# Patient Record
Sex: Female | Born: 1935 | Race: White | Hispanic: No | Smoking: Never smoker
Health system: Southern US, Community
[De-identification: ages and names within clinical notes are randomized; demographics above are authoritative.]

## PROBLEM LIST (undated history)

## (undated) DIAGNOSIS — R627 Adult failure to thrive: Secondary | ICD-10-CM

## (undated) DIAGNOSIS — R209 Unspecified disturbances of skin sensation: Secondary | ICD-10-CM

## (undated) DIAGNOSIS — R413 Other amnesia: Secondary | ICD-10-CM

## (undated) DIAGNOSIS — E559 Vitamin D deficiency, unspecified: Secondary | ICD-10-CM

## (undated) DIAGNOSIS — E785 Hyperlipidemia, unspecified: Secondary | ICD-10-CM

## (undated) DIAGNOSIS — R7301 Impaired fasting glucose: Secondary | ICD-10-CM

## (undated) DIAGNOSIS — F418 Other specified anxiety disorders: Secondary | ICD-10-CM

## (undated) DIAGNOSIS — F32A Depression, unspecified: Secondary | ICD-10-CM

## (undated) DIAGNOSIS — F0390 Unspecified dementia without behavioral disturbance: Secondary | ICD-10-CM

## (undated) DIAGNOSIS — S329XXA Fracture of unspecified parts of lumbosacral spine and pelvis, initial encounter for closed fracture: Secondary | ICD-10-CM

## (undated) DIAGNOSIS — H6120 Impacted cerumen, unspecified ear: Secondary | ICD-10-CM

## (undated) DIAGNOSIS — M810 Age-related osteoporosis without current pathological fracture: Secondary | ICD-10-CM

## (undated) DIAGNOSIS — K635 Polyp of colon: Secondary | ICD-10-CM

## (undated) DIAGNOSIS — J479 Bronchiectasis, uncomplicated: Secondary | ICD-10-CM

## (undated) DIAGNOSIS — H9193 Unspecified hearing loss, bilateral: Secondary | ICD-10-CM

## (undated) DIAGNOSIS — F329 Major depressive disorder, single episode, unspecified: Secondary | ICD-10-CM

## (undated) DIAGNOSIS — D489 Neoplasm of uncertain behavior, unspecified: Secondary | ICD-10-CM

## (undated) HISTORY — DX: Neoplasm of uncertain behavior, unspecified: D48.9

## (undated) HISTORY — PX: JOINT REPLACEMENT: SHX530

## (undated) HISTORY — DX: Polyp of colon: K63.5

## (undated) HISTORY — PX: ORIF HIP FRACTURE: SHX2125

## (undated) HISTORY — DX: Impacted cerumen, unspecified ear: H61.20

## (undated) HISTORY — DX: Vitamin D deficiency, unspecified: E55.9

## (undated) HISTORY — DX: Adult failure to thrive: R62.7

## (undated) HISTORY — DX: Depression, unspecified: F32.A

## (undated) HISTORY — DX: Fracture of unspecified parts of lumbosacral spine and pelvis, initial encounter for closed fracture: S32.9XXA

## (undated) HISTORY — DX: Bronchiectasis, uncomplicated: J47.9

## (undated) HISTORY — DX: Hyperlipidemia, unspecified: E78.5

## (undated) HISTORY — DX: Unspecified disturbances of skin sensation: R20.9

## (undated) HISTORY — DX: Other specified anxiety disorders: F41.8

## (undated) HISTORY — PX: CATARACT EXTRACTION, BILATERAL: SHX1313

## (undated) HISTORY — DX: Unspecified hearing loss, bilateral: H91.93

## (undated) HISTORY — DX: Impaired fasting glucose: R73.01

## (undated) HISTORY — DX: Age-related osteoporosis without current pathological fracture: M81.0

## (undated) HISTORY — DX: Other amnesia: R41.3

## (undated) HISTORY — DX: Unspecified dementia, unspecified severity, without behavioral disturbance, psychotic disturbance, mood disturbance, and anxiety: F03.90

---
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
## 1998-10-01 ENCOUNTER — Other Ambulatory Visit: Admission: RE | Admit: 1998-10-01 | Discharge: 1998-10-01 | Payer: Self-pay | Admitting: Endocrinology

## 1999-05-27 ENCOUNTER — Other Ambulatory Visit: Admission: RE | Admit: 1999-05-27 | Discharge: 1999-05-27 | Payer: Self-pay | Admitting: Podiatry

## 2000-04-25 ENCOUNTER — Other Ambulatory Visit: Admission: RE | Admit: 2000-04-25 | Discharge: 2000-04-25 | Payer: Self-pay | Admitting: Endocrinology

## 2000-07-07 ENCOUNTER — Other Ambulatory Visit: Admission: RE | Admit: 2000-07-07 | Discharge: 2000-07-07 | Payer: Self-pay | Admitting: Orthopedic Surgery

## 2002-10-11 ENCOUNTER — Ambulatory Visit (HOSPITAL_COMMUNITY): Admission: RE | Admit: 2002-10-11 | Discharge: 2002-10-11 | Payer: Self-pay | Admitting: Endocrinology

## 2002-10-11 ENCOUNTER — Encounter: Payer: Self-pay | Admitting: Endocrinology

## 2003-07-24 ENCOUNTER — Ambulatory Visit (HOSPITAL_COMMUNITY): Admission: RE | Admit: 2003-07-24 | Discharge: 2003-07-24 | Payer: Self-pay | Admitting: Gastroenterology

## 2004-06-05 ENCOUNTER — Inpatient Hospital Stay (HOSPITAL_COMMUNITY): Admission: EM | Admit: 2004-06-05 | Discharge: 2004-06-11 | Payer: Self-pay | Admitting: *Deleted

## 2004-06-11 ENCOUNTER — Inpatient Hospital Stay
Admission: RE | Admit: 2004-06-11 | Discharge: 2004-06-17 | Payer: Self-pay | Admitting: Physical Medicine & Rehabilitation

## 2005-04-12 ENCOUNTER — Ambulatory Visit: Payer: Self-pay | Admitting: Pulmonary Disease

## 2005-07-29 IMAGING — CR DG HIP (WITH OR WITHOUT PELVIS) 2-3V*L*
2 series · 2 of 2 positions shown · non-contrast
Comparison: none

CLINICAL DATA: Fall with left hip pain.
 LUMBAR SPINE (4 VIEW)
 There is mild osteopenia.  No acute bony abnormality.  Specifically, no evidence of fracture or malalignment.  Disk spaces well maintained.
 IMPRESSION
 No acute abnormality.
 LEFT HIP (2 VIEW)
 There is a fracture noted through the inferior pubic ramus on the left.  This is presumably acute.  No fracture through the femoral neck.  
 Left inferior pubic rami fracture.
 PELVIS (1 VIEW)
 The left inferior pubic ramus fracture is again noted.  No additional acute bony abnormality. Specifically, no distal fractures.
 Left inferior pubic ramus fracture.

[view not recorded (1 of 2)]
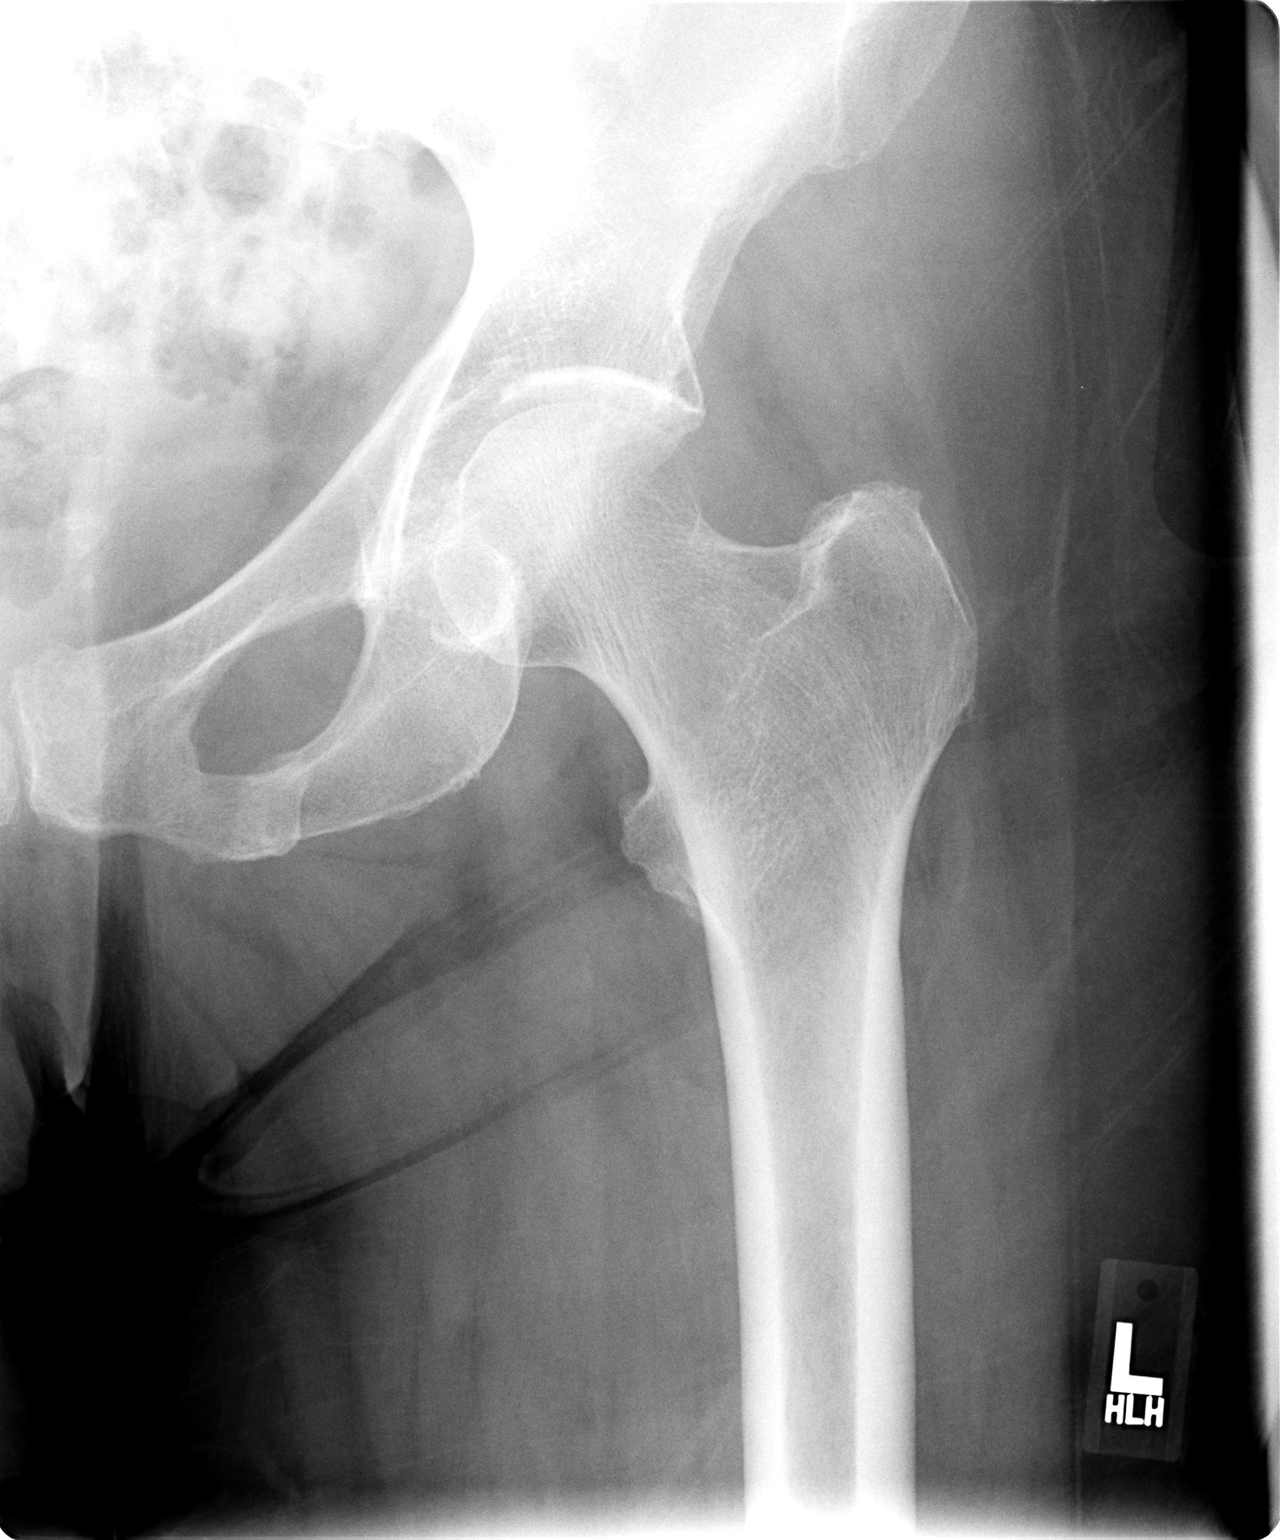

[view not recorded (2 of 2)]
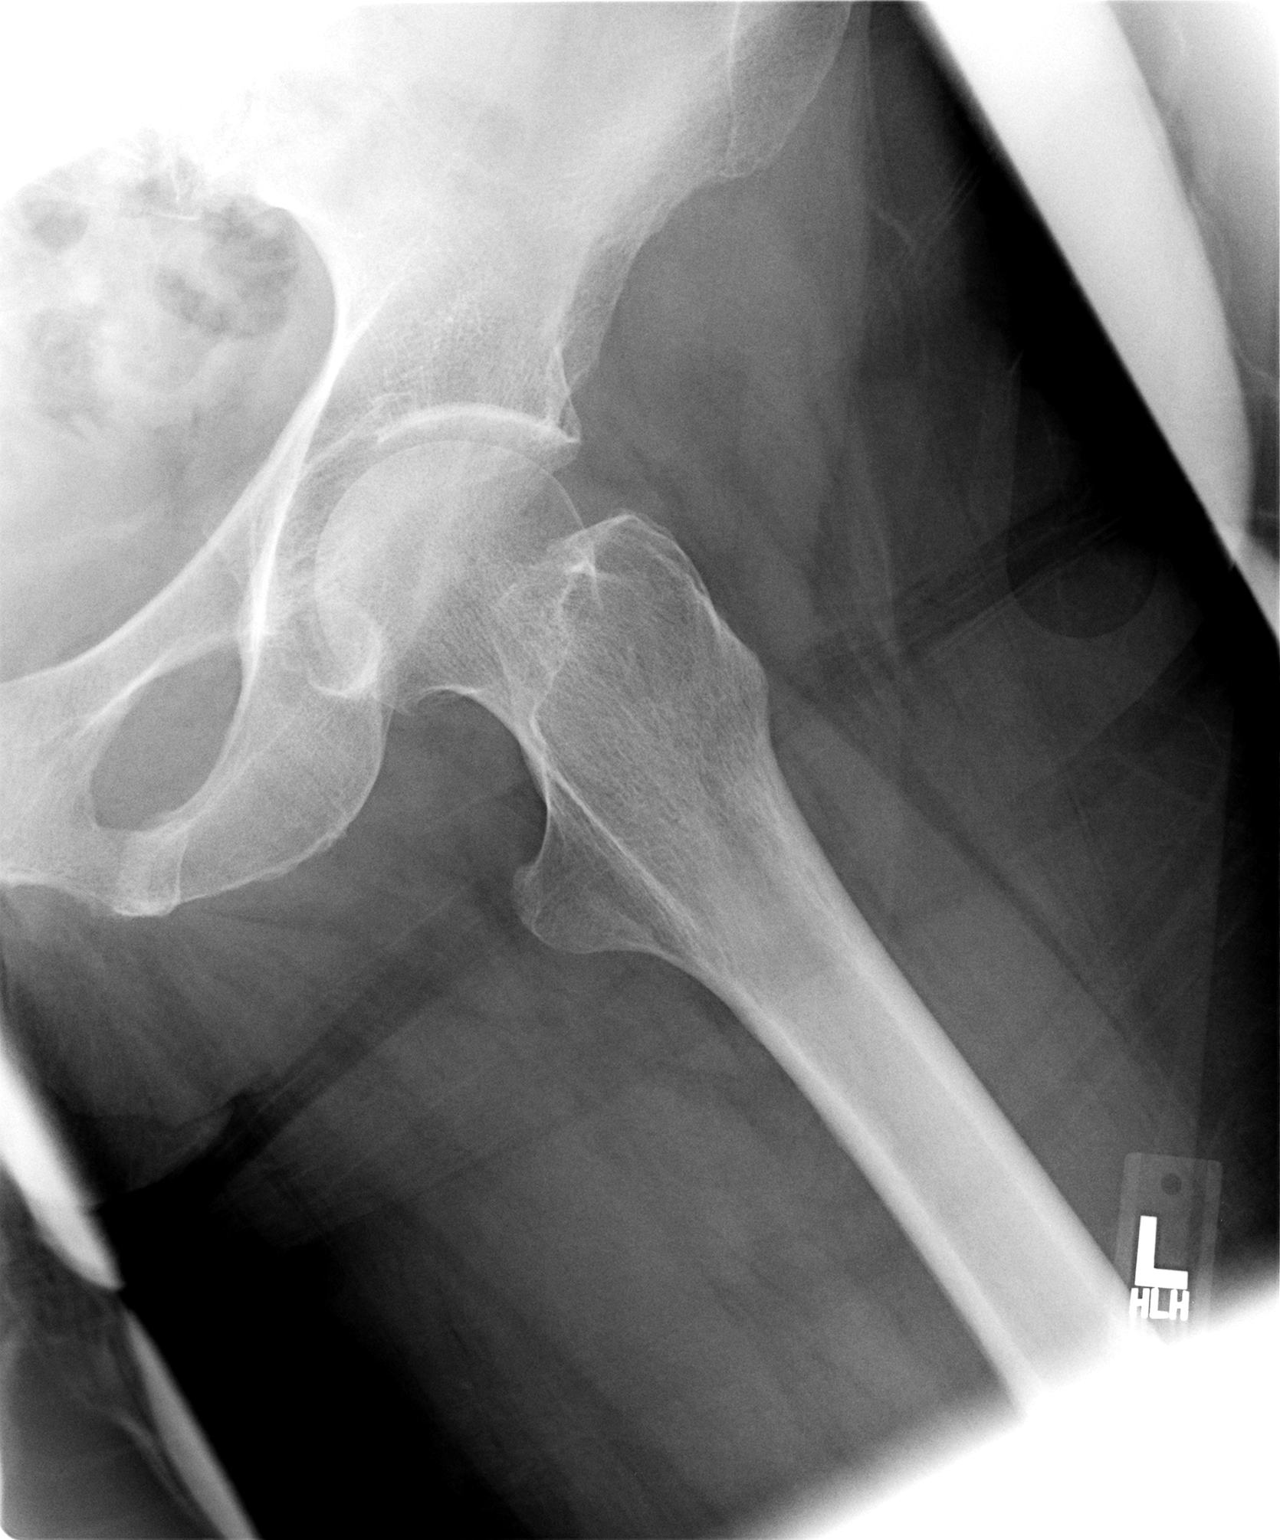

[2 of 2 positions shown; findings below may reference images not displayed]

## 2005-07-29 IMAGING — CT CT RECONSTRUCTION
1 of 4 series · 15 of 36 positions shown, 19 images · non-contrast
Comparison: none

CLINICAL DATA: The patient fell and has pelvic fractures.  
 CT SCAN OF THE PELVIS WITH MULTIPLANAR RECONSTRUCTIONS 06/05/04 
 Scans were performed without contrast through the pelvis.  There is a subtle impaction fracture of the left side of the sacrum.  The fracture is vertical through the left sacral ala.  Also a subtle impaction cortical buckle fracture of the ischium just anterior and medial to the anterior wall of the acetabulum near the origin of the superior pubic ramus.  There is also a minimally displaced fracture of the left inferior pubic ramus.  Note is also made of a 3.0 X 1.6 cm hyperdense lesion in the left ovary.  This could represent a hemorrhagic lesion in the left ovary or possibly calcification in the left ovarian mass.  Uterus and right ovary appear normal in size and configuration.  There is a subtle impaction fracture of the left side of symphysis pubis as well, best demonstrated on the coronal reconstructed images.  
 No other abnormality. 
 IMPRESSION
 Fracture of the left inferior pubic ramus, the left ischium anteriorly just anterior to the acetabulum, and vertically through the left side of the sacrum.  None of the fractures are significantly displaced. 
 Dense lesion in the left ovary which could be a hemorrhagic cyst or a partially calcified ovarian mass.  Pelvic ultrasound recommended for further characterization of this lesion of the left ovary.  
 CT MULTIPLANAR RECONSTRUCTION OF THE PELVIS
 Sagittal and coronal reconstructions were performed which better demonstrate the fractures of the inferior and superior pubic rami and also demonstrate the left sacral ala fracture.  The fracture of the left ischium is less well seen on the reconstructed images. 
 Multiple pelvic fractures as described.

[Series 102: pelvis for fx · axial · 0.70mm/px · z∈[-279,-33]mm · 15 of 215 slices shown, 19 images]
[im 9/215  soft-tissue]
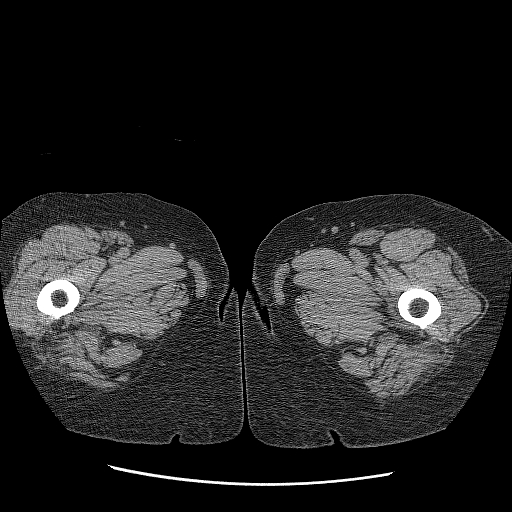
[im 9/215  bone]
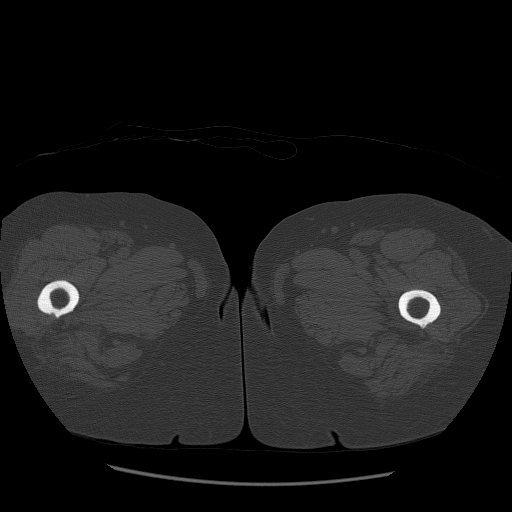
[im 27/215  soft-tissue]
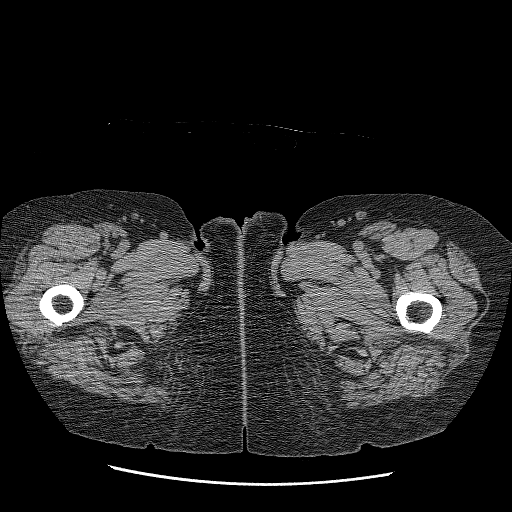
[im 45/215  soft-tissue]
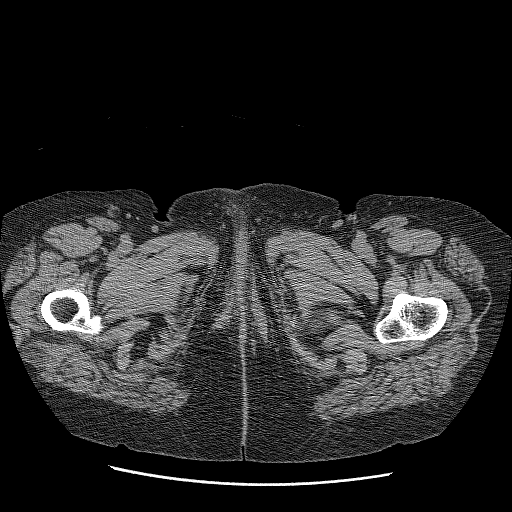
[im 63/215  soft-tissue]
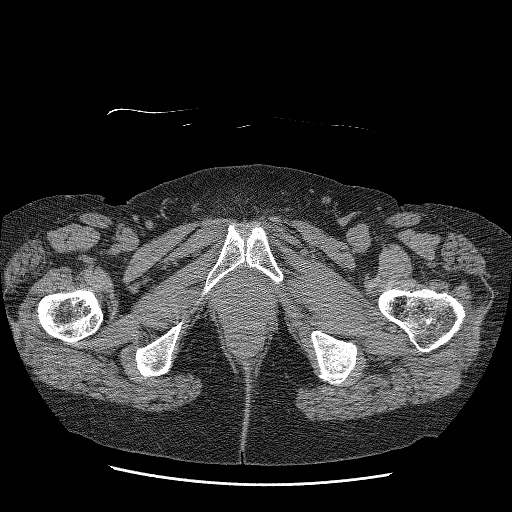
[im 72/215  soft-tissue]
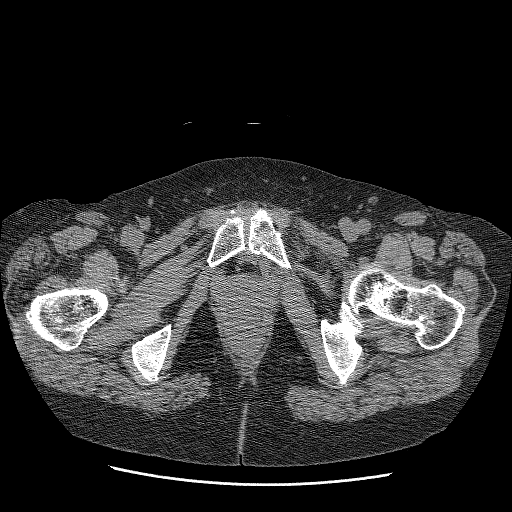
[im 90/215  soft-tissue]
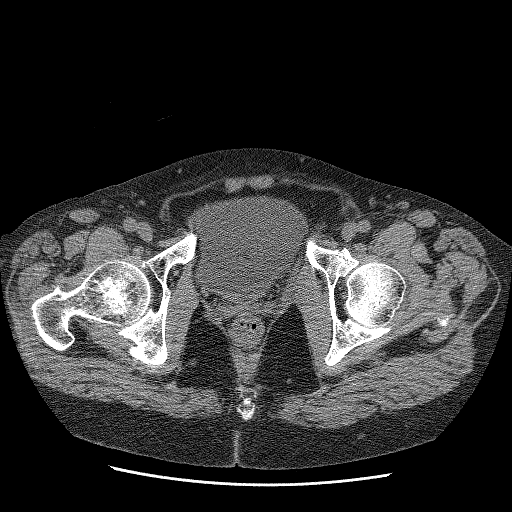
[im 108/215  soft-tissue]
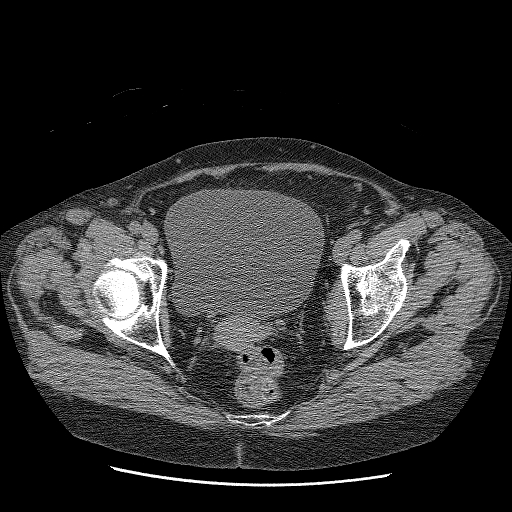
[im 125/215  soft-tissue]
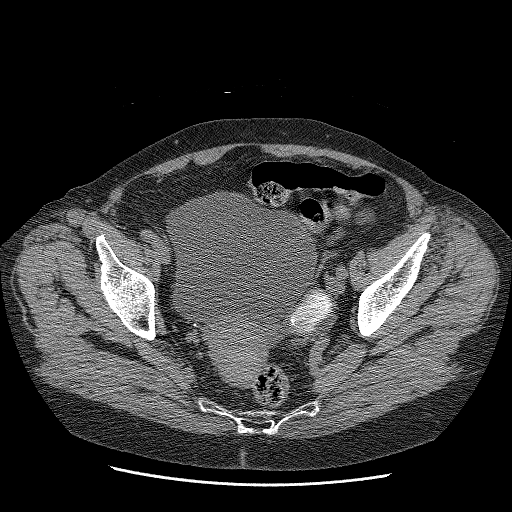
[im 143/215  soft-tissue]
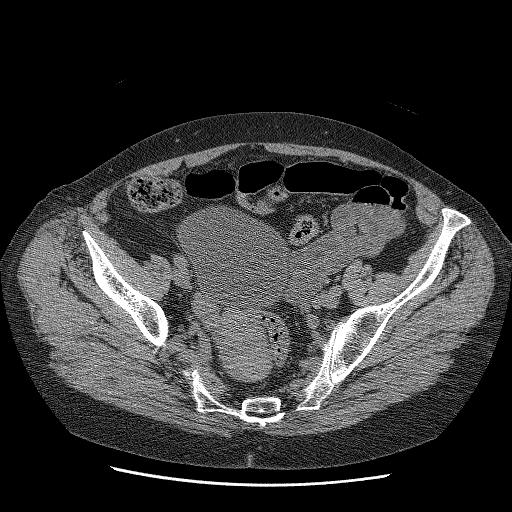
[im 143/215  bone]
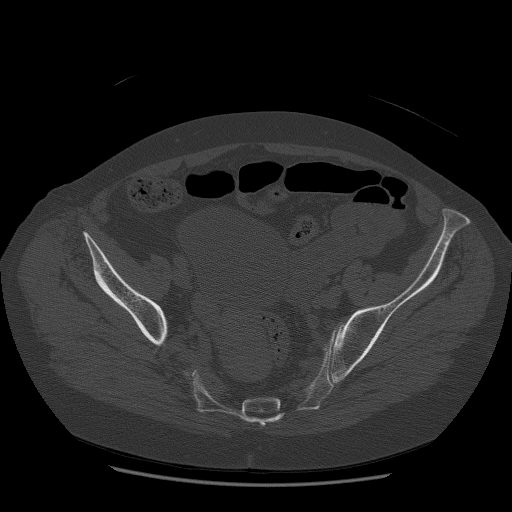
[im 152/215  soft-tissue]
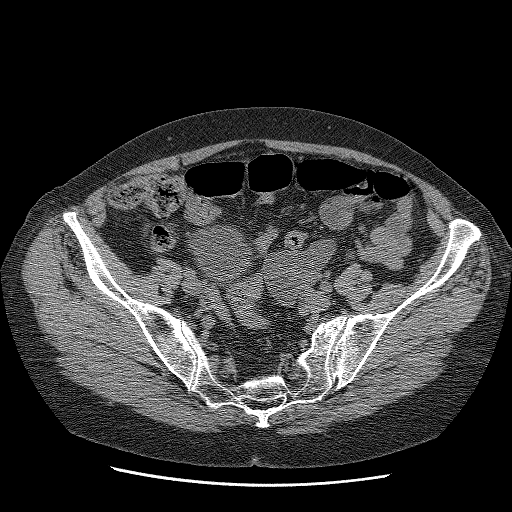
[im 170/215  soft-tissue]
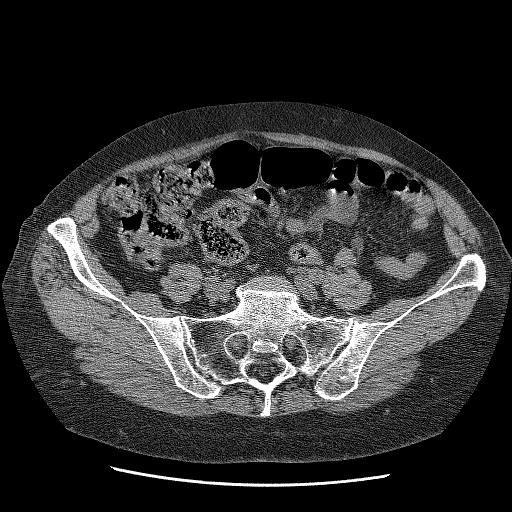
[im 179/215  lung]
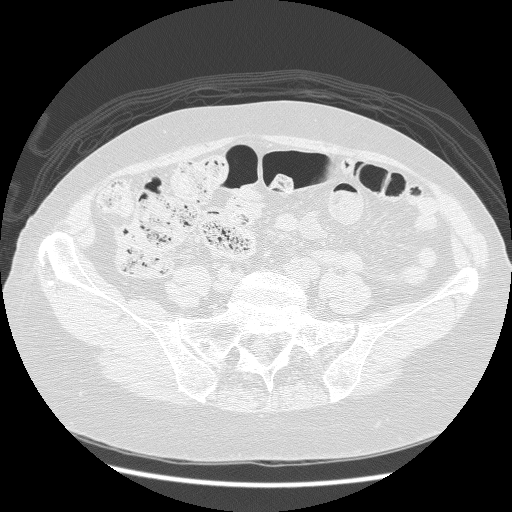
[im 188/215  soft-tissue]
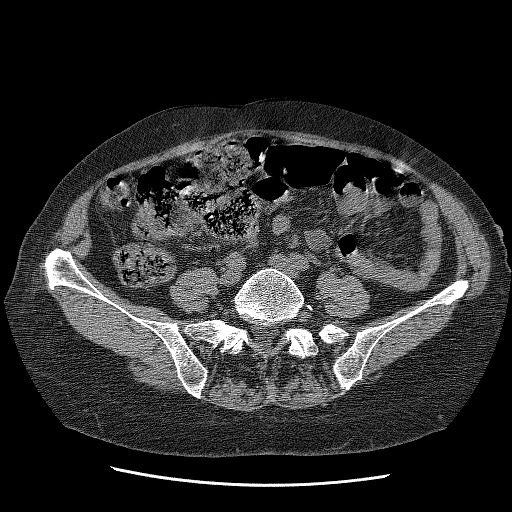
[im 188/215  lung]
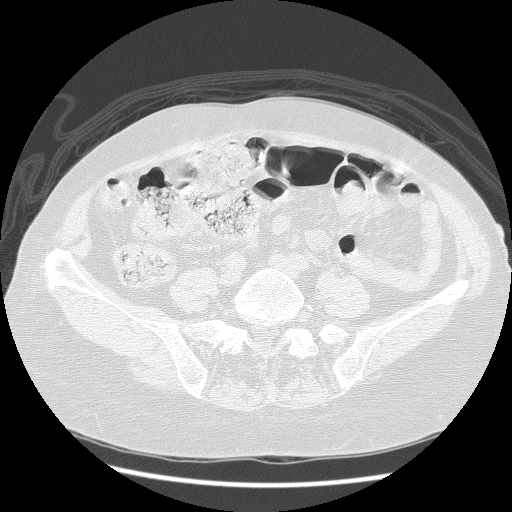
[im 197/215  lung]
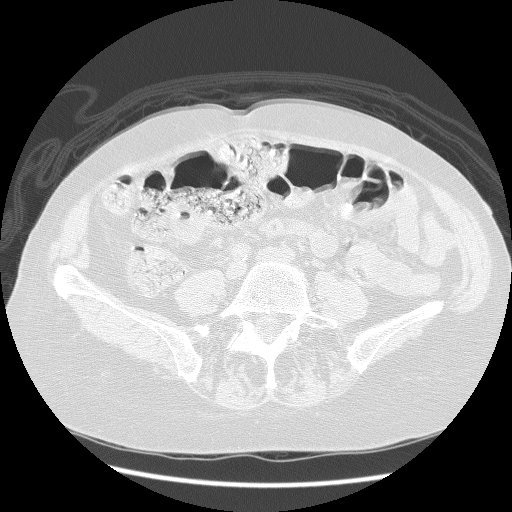
[im 206/215  soft-tissue]
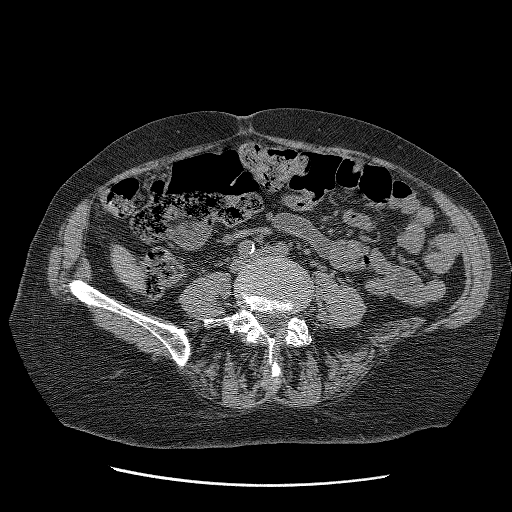
[im 206/215  lung]
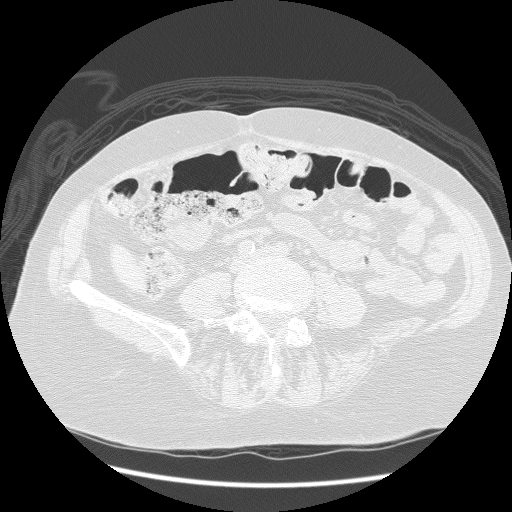

[15 of 36 positions shown; findings below may reference images not displayed]

## 2005-08-26 ENCOUNTER — Ambulatory Visit: Payer: Self-pay | Admitting: Pulmonary Disease

## 2006-01-18 ENCOUNTER — Ambulatory Visit: Payer: Self-pay | Admitting: Pulmonary Disease

## 2006-04-05 ENCOUNTER — Other Ambulatory Visit: Admission: RE | Admit: 2006-04-05 | Discharge: 2006-04-05 | Payer: Self-pay | Admitting: Gynecology

## 2006-06-06 ENCOUNTER — Encounter (INDEPENDENT_AMBULATORY_CARE_PROVIDER_SITE_OTHER): Payer: Self-pay | Admitting: Specialist

## 2006-06-06 ENCOUNTER — Ambulatory Visit (HOSPITAL_COMMUNITY): Admission: RE | Admit: 2006-06-06 | Discharge: 2006-06-07 | Payer: Self-pay | Admitting: Gynecology

## 2006-06-07 HISTORY — PX: OTHER SURGICAL HISTORY: SHX169

## 2007-08-05 ENCOUNTER — Emergency Department (HOSPITAL_COMMUNITY): Admission: EM | Admit: 2007-08-05 | Discharge: 2007-08-05 | Payer: Self-pay | Admitting: Emergency Medicine

## 2011-05-07 NOTE — H&P (Signed)
[name], [name]             ACCOUNT NO.:  [id number]   MEDICAL RECORD NO.:  [id number]          PATIENT TYPE:  AMB   LOCATION:                                FACILITY:  [facility]   PHYSICIAN:  Ivor Costa. Farrel Gobble, M.D.      DATE OF BIRTH:   DATE OF ADMISSION:  DATE OF DISCHARGE:                                HISTORY & PHYSICAL   CHIEF COMPLAINT:  Ovarian mass.   HISTORY OF PRESENT ILLNESS:  The patient is a 77 year old postmenopausal  woman who presented as a new GYN with a complaint of nonspecific discharge.  Based on that, we went ahead and did an ultrasound to evaluate her pelvis,  at which point a solid ovarian mass on the left-hand side was noted.  The  mass measured 3.4 x 2.3 x 3.6 cm.  The patient, of note, had had a CAT scan  as a result of a pelvic injury almost 2 years prior, which showed the  presence of the mass; however, was reported as being small, although with  CAT scan measuring 3 x 1.6.  The patient had no fluid in her pelvis, and the  CA-125 which was nonspecific but slightly elevated at 37.9.  The patient  presents now for a bilateral oophorectomy with pelvic washings.   The patient's past OB/GYN history is unremarkable.  She went into  spontaneous menopause a number of years ago; has not had postmenopausal  bleeding.  She denied any perineal or vulvar pruritus.  She had a Pap smear,  which was normal.  She was noted to have urinary tract infection at her  initial evaluation and most recently to have a bacterial infection in the  vagina as well.   Her past medical history is significant for depression for which she is on  Lexapro and Wellbutrin.  She also has a history of osteoporosis for which  she is on Fosamax D.   SOCIAL HISTORY:  The patient had a breast biopsy in 1972, which was benign,  and a broken knee as a result of a fall.   FAMILY HISTORY:  Negative for gynecologic cancer.   SOCIAL HISTORY:  She is widowed.  She drinks 1 alcoholic beverage a  day.  She drinks 3 cups of coffee a day.  She is highly functional and lives  alone.   PHYSICAL EXAMINATION:  GENERAL:  On examination, she is well-appearing  female in no acute distress.  HEART:  Her heart was regular rate.  LUNGS:  Her lungs were clear to auscultation.  ABDOMEN:  Abdomen was soft and nontender without rebound or guarding.  BREASTS:  Her breasts are without mass, discharge, or retractions.  Axillae  and clavicular lymph nodes are negative.  She had postmenopausal changes to  the external genitalia with some scarring over the clitoral hood and absent  of labia minora.  The vaginal introitus was tight consistent with her  nulliparous status.  The vagina had brown-tinted discharge, but no bleeding  from the os.  There was no cervical motion tenderness.  The uterus was  mobile and nontender, as were  the adnexa.   Wet prep came back positive for yeast vaginitis as well as bacterial  vaginosis.  The patient was so informed.  We will go ahead and treat her  with Flagyl 500 b.i.d. #14, and a single fluconazole preoperatively, but she  will present on the morning of the 18th for a laparoscopic LSO, possible  RSO, with pelvic washings.  Because of her age, the patient will be observed  23 hours postoperatively.      Ivor Costa. Farrel Gobble, M.D.  Electronically Signed     THL/MEDQ  D:  06/02/2006  T:  06/02/2006  Job:  [id number]

## 2011-05-07 NOTE — H&P (Signed)
NAME:  [name], [name]                        ACCOUNT NO.:  [id number]   MEDICAL RECORD NO.:  [id number]                   PATIENT TYPE:  INP   LOCATION:  5158                                 FACILITY:  [facility]   PHYSICIAN:  Tera Mater. Evlyn Kanner, M.D.              DATE OF BIRTH:  Feb 01, 1926   DATE OF ADMISSION:  06/05/2004  DATE OF DISCHARGE:                                HISTORY & PHYSICAL   The patient is a fairly healthy 77 year old white female longstanding  patient of my practice.  She was out working in the garden and her dog was  at her side.  When the neighbors dog came by, the dog started chasing and  grabbed the patient up in the leash and threw her to the ground.  She had  pelvic pain and inability to stand.  A friend helped her get to the  emergency room and she is now found to have multiple pelvic fractures.  She  is in fairly significant pain, although the medication has kicked in.  She  has had no chest pain or shortness of breath.  There was no neurological  complaint prior to this.  This was purely an accidental fall.  In general,  she has been doing well recently with no active complaints.   PAST MEDICAL HISTORY:  Past medical history includes osteoporosis with the  last DEXA in 12/03 with a T-score of the LS-spine of -2.3 and a T-score at  the femoral neck of -2.1.  She has a history of hyperlipidemia, history of  hematuria, history of benign positional vertigo.  She has had a history of  colonic polyps with the last colonoscopy 2004.  Had a history of depression.  Had a history of bronchoscopy after a pulmonary infection in 1991.   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   SOCIAL HISTORY:  Social history reveals she is widowed in the last couple of  years.  She has three children and two grandchildren.  She is a non-smoker,  non-drinker.  She has no known drug  allergies.   FAMILY HISTORY:  Family history reveals mother is alive at 84, dad died of  heart disease and diabetes.   MEDICATIONS:  Medications include Fosamax 70 daily, Effexor XR 75 daily,  Vytorin 10/40 daily, aspirin 81 daily, calcium and Ocuvite.   PHYSICAL EXAMINATION:  VITAL SIGNS:  Blood pressure is 132/80, pulse 74,  respirations 20, temperature 97.4.  GENERAL:  Healthy non-toxic appearing white female sitting up in no distress  at the present time.  HEENT:  Sclerae are anicteric.  No lid lag or exophthalmus is present.  No  nystagmus is present.  Oral mucous membranes moist.  Head is atraumatic.  NECK:  Supple without JVD, bruits or thyromegaly.  LUNGS:  Clear without wheezes, rales or rhonchi.  No  accessory muscles in  use.  HEART:  Regular with no murmurs, rubs or gallops.  ABDOMEN:  Soft, nontender.  No hepatosplenomegaly.  No masses or pulsations  are present.  EXTREMITIES:  Strong distal pulses bilaterally.  No edema is present.  There  is pain with movement of either leg.  NEUROLOGICAL:  The patient is awake, alert.  Mentating well.  Speech is  clear.  No tremors present.  No hallucination or delusions present.  Grip is  equal bilaterally.  No neurological deficits are noted.  Sensation is intact  to light touch distally.   We have a 77 year old white female suffering multiple pelvic fractures after  a fall.  Clearly with her living alone and need for pain control and  stabilization, she needs to come to the hospital.  We may have to have a  rehab or skilled nursing facility stay depending on her clinical process.  Miacalcin will be added for pain.  Lovenox will be  covered while immobile  and we will get her back on her Fosamax when she is able to sit up and eat  well.  There is no evidence of any cardiac or neurological problems related  to this.  There is no evidence of any compression neuropathy or other  compromise or pelvic fracture.  The patient should do  clinically well.                                                Tera Mater. Evlyn Kanner, M.D.    SAS/MEDQ  D:  06/11/2004  T:  06/12/2004  Job:  [id number]

## 2011-05-07 NOTE — Op Note (Signed)
   NAME:  [name], [name]                        ACCOUNT NO.:  [id number]   MEDICAL RECORD NO.:  [id number]                   PATIENT TYPE:  AMB   LOCATION:  ENDO                                 FACILITY:  [facility]   PHYSICIAN:  John C. Madilyn Fireman, M.D.                 DATE OF BIRTH:  09-Oct-1926   DATE OF PROCEDURE:  07/24/2003  DATE OF DISCHARGE:                                 OPERATIVE REPORT   PROCEDURE:  Colonoscopy.   INDICATION FOR PROCEDURE:  History of adenomatous colon polyps with last  colonoscopy five years ago.   DESCRIPTION OF PROCEDURE:  The patient was placed in the left lateral  decubitus position and placed on the pulse monitor with continuous low-flow  oxygen delivered by nasal cannula.  She was sedated with 75 mcg IV fentanyl  and 7 mg IV Versed.  The Olympus video colonoscope was inserted into the  rectum and advanced to the cecum, confirmed by transillumination at  McBurney's point and visualization of the ileocecal valve and appendiceal  orifice.  The prep was good.  The cecum, ascending, transverse, descending,  and sigmoid colon all appeared normal with no masses, polyps, diverticula,  or other mucosal abnormalities.  The rectum likewise appeared normal, and  retroflexed view of the anus revealed no obvious internal hemorrhoids.  The  colonoscope was then withdrawn and the patient returned to the recovery room  in stable condition.  She tolerated the procedure well, and there were no  immediate complications.   IMPRESSION:  Normal colonoscopy.   PLAN:  Repeat study in five years based on her prior history of polyps.                                               John C. Madilyn Fireman, M.D.    JCH/MEDQ  D:  07/24/2003  T:  07/25/2003  Job:  [id number]   cc:   Jeannett Senior A. Evlyn Kanner, M.D.  [address]  [place]  [place] [address]  Fax: [phone]

## 2011-05-07 NOTE — Discharge Summary (Signed)
NAME:  [name], [name]                        ACCOUNT NO.:  [id number]   MEDICAL RECORD NO.:  [id number]                   PATIENT TYPE:  INP   LOCATION:  5158                                 FACILITY:  [facility]   PHYSICIAN:  Tera Mater. Evlyn Kanner, M.D.              DATE OF BIRTH:  1926/09/20   DATE OF ADMISSION:  06/05/2004  DATE OF DISCHARGE:  06/10/2004                                 DISCHARGE SUMMARY   DISCHARGE DIAGNOSES:  1. Multiple pelvic fractures after a fall.  2. History of known osteoporosis.  3. Hyperlipidemia.  4. History of colonic polyps.  5. Depression, clinically stable.   HOSPITAL COURSE:  [name] is a healthy 77 year old white female with a  history of osteoporosis without prior fractures.  She was working in the  garden and the dog that was with her started chasing another dog and she got  tangled up in the leash and was thrown to the ground.  She was seen in the  emergency room with multiple pelvic fractures and inability to ambulate.  The patient does live by herself and due to pain and mobility issues, was  brought into the hospital for stabilization.  Fortunately, her hospital  course has been very good.  She has had no evidence of DVT, no evidence of  pulmonary embolus and no evidence of substantial bleeding.  She has had  progressive improvement to the point that she can now hop a bit with a  walker with less pain.  She still has pain with sitting and with turning  over.  She is sleeping well.  Her bowels are working well.  She has had no  cardiac or neurologic complaints.  She has had no compression neuropathy  symptoms.  Overall, she has done well.  She has no active complaints, other  than the pelvic pain.   MEDICATIONS:  Medications will include:  1. Fosamax with D 70 mg daily.  2. Effexor XR 75 mg daily.  3. Vytorin 10/40 daily.  4. She is on Lovenox until more ambulatory and then will resume her aspirin.  5. She is on calcium.   LABORATORY  DATA:  She did have a urine at presentation which was basically  negative.   She had a CT of the pelvis showing fracture of the left inferior pubic  ramus, left ischium anteriorly just inferior to the acetabulum and  vertically to the left side of the sacrum and none of the fractures were  significantly displaced.  There was a dense lesion of the left ovary with a  hemorrhagic cyst or possibly a calcified ovarian mass and a pelvic  ultrasound was recommended; that has not yet been completed.   Plain film x-rays showed a left inferior pubic ramus fracture.   SUMMARY:  In summary, we have a healthy 77 year old white female who had a  fall and fractured her pelvis.  We are now working on  ambulation and she is  going to go to [facility] for further strengthening and independence.  I suspect  this will be a short hospital stay.  As an outpatient, when she is up to it,  we will consider adding an ultrasound of the ovaries; at the present time, I  think this would be a very painful thing for her and I do not suspect that  this is a lesion of any significance.   DIET:  Diet is regular.   ACTIVITY:  Activity is as tolerated.  OT and PT will be needed.                                                Tera Mater. Evlyn Kanner, M.D.    SAS/MEDQ  D:  06/11/2004  T:  06/11/2004  Job:  [id number]

## 2011-05-07 NOTE — Op Note (Signed)
[name], [name]              ACCOUNT NO.:  [id number]   MEDICAL RECORD NO.:  [id number]          PATIENT TYPE:  AMB   LOCATION:  SDC                           FACILITY:  [facility]   PHYSICIAN:  Ivor Costa. Farrel Gobble, M.D. DATE OF BIRTH:  10-21-1926   DATE OF PROCEDURE:  06/06/2006  DATE OF DISCHARGE:                                 OPERATIVE REPORT   PREOPERATIVE DIAGNOSIS:  Left ovarian mass.   POSTOPERATIVE DIAGNOSIS:  Left ovarian mass.   PROCEDURE:  1.  Laparoscopic bilateral salpingo-oophorectomy.  2.  Pelvic washings.   SURGEON:  Ivor Costa. Farrel Gobble, M.D.   ASSISTANT:  Rande Brunt. Eda Paschal, M.D.   ANESTHESIA:  General.   IV FLUIDS:  1700 mL lactated Ringer's.   ESTIMATED BLOOD LOSS:  Minimal.   URINE OUTPUT:  100 mL clear urine.   FINDINGS:  Was enlarged left ovary that was mobile and atrophic right ovary  and uterus was unremarkable for pedunculated subserosal fibroids.   PATHOLOGY:  Tubes, ovaries and a questionable fibroid versus ovarian  remnant.   PROCEDURE:  The patient was taken to operating room.  General anesthesia was  induced and placed in dorsal lithotomy position, prepped and draped usual  sterile fashion.  A bivalve speculum was placed in the vagina.  Cervix was  visualized and grasped with the single-tooth tenaculum.  Cervical os was  noted to be stenotic and the uterine manipulator could not be placed  therefore the single tooth tenaculum was rotated placed anterior-posterior  on the cervix.  Gloves were then changed.  Infraumbilical incision was made  with the scalpel and the pelvis was entered with low flow on the Optiview  scope. Placement in the abdomen was confirmed. The pneumoperitoneum was then  created.  The patient was placed in Trendelenburg.  We switched to operative  scope in order to sweep bowels away from the pelvis.  The patient was noted  to have markedly dilated loops of bowel that was obstructing the pelvic  view. Also limited  visualization of the upper abdomen.  Once the bowels were  swept away two lower ports were then made under direct visualization 5 mm.  Using these two lower ports pelvic washings were obtained.  The uterus was  elevated. The left adnexa was inspected. The ureter was seen with  peristalsis. This was actually done bilaterally.  The IP ligament was then  elevated, identified and treated with the tripolar. The dissection carried  through and then a small amount of bleeding was treated with cautery and  hemostasis was assured.  We then started to approach the adnexa from the  uterine side for better visualization and similarly the tubo-ovarian  ligament and tube were transected after treatment with cautery.  The ovary  was difficult to manipulate because of this size. The single-tooth tenaculum  had fallen off. Attention was then turned back to the vagina.  Cervix was  revisualized. Dilator was placed in order to help place the uterine  manipulator.  While the dilator was identifying the cervix it did perforate  the uterus. Bleeding was noted and it was at  the angle where there was a  fibroid. The area of bleeding on the uterus was then treated successfully  with the tripolar, irrigated and hemostasis of this area was confirmed. At  this point the patient received cefoxitin because of the uterine  perforation.  The dilator was successfully placed. Uterus was then able to  be rotated and the adnexa was successfully dissected off safely. It was  placed in the posterior cul-de-sac.  The uterus was then rotated towards the  left side. The IP was identified. The ureter was identified and similarly  cauterized and transected with the tripolar until the uterus was reached.  Questioned at the end whether or not we had left an ovarian remnant close to  the uterus and this was grasped and sharply dissected off. Post dissection  however, it seemed more fibroid than ovarian remnant. Three specimens were   then placed in the posterior cul-de-sac.  The 5 mm port was placed in the  left lower quadrant. The bag was placed, Endopouch was placed through the  infraumbilical port and noted two of three specimens were placed in the  pouch and brought through to the umbilical port. The incision was then  extended with the scalpel on the skin and sharply at the fascia and then the  specimens were removed in toto in the bag. The skin and fascia was then  clamped with an Allis in order to reestablish hemoperitoneum. Reinspection  of the pelvis assured hemostasis of all surgical lines. The small fibroid  was grasped with a lower port and brought out through the operative scope.  There was bleeding noted from the muscle and fascia on the extension of the  umbilical port.  The area was irrigated, elevated in the fashion, and  peritoneum was plicated in a running suture with 0 Vicryl.  Hemostasis was  assured. A small area of bleeding in the subcu was treated with cautery.  The skin was then reapproximated with 4-0 plain.  The lower ports were  removed under visualization.  The lower ports were reapproximated with Steri-  Strips. Total of 10 mL of 0.25% Marcaine was placed in the incision and  small pressure dressing was placed in the infraumbilical port.  A speculum  was then placed back in the vagina.  Cervix was visualized.  There was a  clot in the vagina.  However, once swept away, no further cervical bleeding  was appreciated.  The patient tolerated procedure well.  Sponge and needle  counts correct x2.  She was transferred to PACU in stable condition.      Ivor Costa. Farrel Gobble, M.D.  Electronically Signed     THL/MEDQ  D:  06/06/2006  T:  06/07/2006  Job:  [id number]

## 2011-05-07 NOTE — Discharge Summary (Signed)
[name], [name]              ACCOUNT NO.:  [id number]   MEDICAL RECORD NO.:  [id number]          PATIENT TYPE:  OIB   LOCATION:  9306                          FACILITY:  [facility]   PHYSICIAN:  Ivor Costa. Farrel Gobble, M.D. DATE OF BIRTH:  [birth year]   DATE OF ADMISSION:  06/06/2006  DATE OF DISCHARGE:  06/07/2006                                 DISCHARGE SUMMARY   PRINCIPAL DIAGNOSIS:  Solid left ovarian mass.   PRINCIPAL PROCEDURE:  Laparoscopic bilateral salpingo-oophorectomy.   ADDITIONAL PROCEDURE:  Pelvic washings.  Refer to the dictated H&P.   HOSPITAL COURSE:  The patient presented on the morning of June 06, 2006 and  underwent a laparoscopic BSO with pelvic washings under general anesthesia  for a grossly enlarged left ovary, atrophic right ovary and a subserosal  uterine fibroid as her findings.  The patient tolerated the procedure well.  She was transferred to the recovery room and because of age, she was  transferred to the postop floor for overnight observation.  The patient did  very well overnight.  She failed to have any pain.  She tolerated a regular  diet at the time.  By the morning of postop day #1, however, the patient had  just had her Foley removed and had not ambulated yet, but was awake and  alert, and was without any complaints.  She tolerated a regular diet.  She  remained afebrile.  All vitals were stable.  Her heart rate was a regular  rate.  Her lungs were clear to auscultation.  She had normoactive bowel  sounds.  Her abdomen was soft and nontender.  Her incisions were dry and  intact.  The patient will be ambulated this morning, and if successful  ambulation and voiding, she will then be discharged home with instructions  to follow up in the office in 2 weeks.  Since she did not require any pain  medications during her hospitalization, the patient will use over-the-  counter Motrin as needed.      Ivor Costa. Farrel Gobble, M.D.  Electronically Signed     THL/MEDQ  D:  06/07/2006  T:  06/07/2006  Job:  [id number]

## 2011-05-07 NOTE — Discharge Summary (Signed)
[name], [name]                        ACCOUNT NO.:  [id number]   MEDICAL RECORD NO.:  [id number]                   PATIENT TYPE:  ORB   LOCATION:  4524                                 FACILITY:  [facility]   PHYSICIAN:  Ranelle Oyster, M.D.             DATE OF BIRTH:  1926/02/04   DATE OF ADMISSION:  06/11/2004  DATE OF DISCHARGE:  06/17/2004                                 DISCHARGE SUMMARY   DISCHARGE DIAGNOSES:  1. Pelvic fracture.  2. Dyslipidemia.   HISTORY OF PRESENT ILLNESS:  [name] is a 77 year old female with  osteoporosis with fall on June 17, with inability to ambulate.  X-ray in the  ED showed fracture of the inferior superior pubic rami and left sacral ala.  The patient was admitted for pain control and on bed rest until June 20.  Pain control currently with Vicodin.  Therapies initiated with minimal  assist for bed mobility, supervision for transfers, minimal assist with  ambulating 30 feet with a rolling walker.  [facility] was consulted for  progressive therapies.   PAST MEDICAL HISTORY:  1. Depression.  2. Osteoporosis.  3. Dyslipidemia.  4. Occasional vertigo.  5. Left patella ORIF.  6. Tonsillectomy.   ALLERGIES:  No known drug allergies.   SOCIAL HISTORY:  The patient lives alone in one-level home with four to five  steps to entry.  Independent prior to admission.  She does not use any  tobacco.  She has a glass of wine in the evening.   FAMILY HISTORY:  Positive for coronary artery disease.   HOSPITAL COURSE:  [name] was admitted to [facility] on June 11, 2004, with  therapies to consist of PT/OT daily.  Past admission, labs were done showing  hemoglobin 11.3, hematocrit 33.6, white count 5.9, platelets 349.  Sodium  was 138, potassium 4.3, chloride 106, CO2 27, BUN 15, creatinine 0.2,  glucose 100.  The patient has had DVT prophylaxis.  She was on Miacalcin and  Os-Cal for her osteoporosis.  The patient's pain control was reasonable with  the use of p.r.n. medications.  She was able to participate in therapy and  progressed along well during her stay.  By the time of discharge, the  patient was modified independent level for all ADLs including simple home  management tasks.  She was modified independent for toileting.  She was  modified independent for transfers, modified independent for ambulating 400  feet with a rolling walker.  Further followup therapies to transition to  home including home health PT/OT.  On June 17, 2004, the patient is  discharged to home.   DISCHARGE MEDICATIONS:  1. Coated aspirin 81 mg a day.  2. Effexor XR 75 mg a day.  3. Miacalcin nasal spray one squirt daily.  4. Os-Cal plus D one p.o. t.i.d.  5. Zocor 40 mg q.h.s.  6. Vicodin one p.o. q.3-4h. p.r.n. pain.  7. Only use  Tylenol for pain.   ACTIVITY:  Use walker.   DIET:  Low fat diet.   SPECIAL INSTRUCTIONS:  No alcohol and no driving.   FOLLOW UP:  The patient is to follow up with Dr. Evlyn Kanner for routine check.  Follow up with Dr. Riley Kill as needed.      Greg Cutter, P.A.                    Ranelle Oyster, M.D.    PP/MEDQ  D:  07/09/2004  T:  07/10/2004  Job:  [id number]   cc:   Jeannett Senior A. Evlyn Kanner, M.D.  [address]  [place]  [place] [address]  Fax: [phone]

## 2013-05-23 ENCOUNTER — Encounter (INDEPENDENT_AMBULATORY_CARE_PROVIDER_SITE_OTHER): Payer: Medicare Other | Admitting: Ophthalmology

## 2013-05-23 DIAGNOSIS — H43819 Vitreous degeneration, unspecified eye: Secondary | ICD-10-CM

## 2013-05-23 DIAGNOSIS — H35349 Macular cyst, hole, or pseudohole, unspecified eye: Secondary | ICD-10-CM

## 2014-02-21 ENCOUNTER — Ambulatory Visit (INDEPENDENT_AMBULATORY_CARE_PROVIDER_SITE_OTHER): Payer: Medicare Other | Admitting: Ophthalmology

## 2015-12-11 DIAGNOSIS — L219 Seborrheic dermatitis, unspecified: Secondary | ICD-10-CM | POA: Insufficient documentation

## 2018-08-02 LAB — LIPID PANEL
Cholesterol: 215 — AB (ref 0–200)
HDL: 75 — AB (ref 35–70)
LDL Cholesterol: 119
LDl/HDL Ratio: 1.6
Triglycerides: 104 (ref 40–160)

## 2018-08-02 LAB — BASIC METABOLIC PANEL
BUN: 16 (ref 4–21)
Creatinine: 0.9 (ref 0.5–1.1)
Glucose: 102
Potassium: 4.6 (ref 3.4–5.3)
Sodium: 139 (ref 137–147)

## 2018-08-02 LAB — HEMOGLOBIN A1C: Hemoglobin A1C: 5.7

## 2018-08-02 LAB — HEPATIC FUNCTION PANEL
ALT: 9 (ref 7–35)
AST: 17 (ref 13–35)
Alkaline Phosphatase: 91 (ref 25–125)
Bilirubin, Total: 0.9

## 2018-08-02 LAB — CBC AND DIFFERENTIAL
HCT: 42 (ref 36–46)
Hemoglobin: 13.2 (ref 12.0–16.0)
Platelets: 367 (ref 150–399)
WBC: 8.1

## 2018-08-02 LAB — VITAMIN D 25 HYDROXY (VIT D DEFICIENCY, FRACTURES): Vit D, 25-Hydroxy: 46.8

## 2019-08-01 ENCOUNTER — Encounter: Payer: Self-pay | Admitting: Internal Medicine

## 2019-08-08 LAB — CBC AND DIFFERENTIAL
HCT: 43 (ref 36–46)
Hemoglobin: 13.7 (ref 12.0–16.0)
Platelets: 351 (ref 150–399)
WBC: 7.3

## 2019-08-08 LAB — HEPATIC FUNCTION PANEL
ALT: 9 (ref 7–35)
AST: 16 (ref 13–35)
Alkaline Phosphatase: 96 (ref 25–125)
Bilirubin, Total: 0.7

## 2019-08-08 LAB — LIPID PANEL
Cholesterol: 231 — AB (ref 0–200)
HDL: 59 (ref 35–70)
LDL Cholesterol: 150
Triglycerides: 112 (ref 40–160)

## 2019-08-08 LAB — BASIC METABOLIC PANEL
BUN: 14 (ref 4–21)
Creatinine: 0.8 (ref 0.5–1.1)
Glucose: 102
Potassium: 4.5 (ref 3.4–5.3)
Sodium: 137 (ref 137–147)

## 2019-08-08 LAB — HEMOGLOBIN A1C: Hemoglobin A1C: 5.7

## 2019-08-08 LAB — VITAMIN D 25 HYDROXY (VIT D DEFICIENCY, FRACTURES): Vit D, 25-Hydroxy: 45.6

## 2019-09-27 ENCOUNTER — Ambulatory Visit: Payer: Self-pay | Admitting: Internal Medicine

## 2019-09-28 ENCOUNTER — Telehealth: Payer: Self-pay | Admitting: General Practice

## 2019-09-28 NOTE — Telephone Encounter (Signed)
Called and left message that we would need new patient paperwork back today - that we have still not received it and to please call.  Patients phone number is not correct

## 2019-10-01 ENCOUNTER — Ambulatory Visit (INDEPENDENT_AMBULATORY_CARE_PROVIDER_SITE_OTHER): Payer: Medicare Other | Admitting: Internal Medicine

## 2019-10-01 ENCOUNTER — Encounter: Payer: Self-pay | Admitting: Internal Medicine

## 2019-10-01 ENCOUNTER — Other Ambulatory Visit: Payer: Self-pay

## 2019-10-01 ENCOUNTER — Encounter (INDEPENDENT_AMBULATORY_CARE_PROVIDER_SITE_OTHER): Payer: Self-pay

## 2019-10-01 VITALS — BP 124/80 | HR 76 | Temp 97.8°F | Resp 18 | Ht 66.0 in | Wt 149.4 lb

## 2019-10-01 DIAGNOSIS — F321 Major depressive disorder, single episode, moderate: Secondary | ICD-10-CM | POA: Diagnosis not present

## 2019-10-01 DIAGNOSIS — H6123 Impacted cerumen, bilateral: Secondary | ICD-10-CM | POA: Insufficient documentation

## 2019-10-01 DIAGNOSIS — M810 Age-related osteoporosis without current pathological fracture: Secondary | ICD-10-CM

## 2019-10-01 DIAGNOSIS — G301 Alzheimer's disease with late onset: Secondary | ICD-10-CM

## 2019-10-01 DIAGNOSIS — W19XXXA Unspecified fall, initial encounter: Secondary | ICD-10-CM | POA: Insufficient documentation

## 2019-10-01 DIAGNOSIS — M4010 Other secondary kyphosis, site unspecified: Secondary | ICD-10-CM

## 2019-10-01 DIAGNOSIS — L2084 Intrinsic (allergic) eczema: Secondary | ICD-10-CM

## 2019-10-01 DIAGNOSIS — H04123 Dry eye syndrome of bilateral lacrimal glands: Secondary | ICD-10-CM | POA: Insufficient documentation

## 2019-10-01 DIAGNOSIS — Z23 Encounter for immunization: Secondary | ICD-10-CM | POA: Diagnosis not present

## 2019-10-01 DIAGNOSIS — F0280 Dementia in other diseases classified elsewhere without behavioral disturbance: Secondary | ICD-10-CM

## 2019-10-01 DIAGNOSIS — L821 Other seborrheic keratosis: Secondary | ICD-10-CM

## 2019-10-01 DIAGNOSIS — W19XXXS Unspecified fall, sequela: Secondary | ICD-10-CM

## 2019-10-01 DIAGNOSIS — Z7189 Other specified counseling: Secondary | ICD-10-CM | POA: Diagnosis not present

## 2019-10-01 DIAGNOSIS — L219 Seborrheic dermatitis, unspecified: Secondary | ICD-10-CM | POA: Diagnosis not present

## 2019-10-01 LAB — COMPLETE METABOLIC PANEL WITH GFR
Albumin: 4
Calcium: 9.9
Carbon Dioxide, Total: 26
Chloride: 102
EGFR (Non-African Amer.): 58.8
Total Protein: 7.2 g/dL

## 2019-10-01 NOTE — Progress Notes (Signed)
Provider:  Dr. Hollace Kinnier Location:   [facility]   Place of Service:    PCP: Gayland Curry, DO Patient Care Team: Gayland Curry, DO as PCP - General (Geriatric Medicine)  Extended Emergency Contact Information Primary Emergency Contact: [facility] Phone: [phone] Relation: None  Code Status: DNR Goals of Care: Advanced Directive information Advanced Directives 10/01/2019  Does Patient Have a Medical Advance Directive? Yes  Type of Advance Directive Living will  Does patient want to make changes to medical advance directive? No - Patient declined      Chief Complaint  Patient presents with  . Establish Care    New patient transferring from Dr. Forde Dandy    HPI: Patient is a 85 y.o. female seen today for establishment of care as a new patient.   Daughter present with patient today.   She has been seen in the past by Dr. Nyoka Cowden (retired) and then Dr. Forde Dandy. Daughter requesting the change in PCP due to proximity. The change will help patient have visits at [facility] instead of outside PCP office.   She is a resident of [facility]. Lives in the independent living apartments.   She continues to struggle with her memory at times. She is not consistient in taking her medication daily. Daughter has hired [facility] aides to help with ADL's three times a week. Patient states they do not like being checked on. She does not want to give up her independence. Daughter claims her apartment smells of urine. She thinks her mother forgets to clean when their is a accident.   Recently started on Aricept for her memory issues. Medication was started within the past week.   She fell about 2 weeks ago. She lost her balance and fell on her bottom. No injuries. Does not use any assistive devices to ambulate. History of hip fracture over 10 years ago.   Earlier this year her daughter adopted a dog for companionship. The lack of social interactions due to  covid made the patient feel sad and depressed. Patient is much happier having a dog at the home. Daughter states her mothers mood has improved by having a pet.   Chronic cerumen buildup in both ears. She sees a ENT, Dr. Erik Obey, was seen two weeks ago.   She will eat one main meal a day and eat lightly the rest of the day. She drinks a lot of sweet tea and Dr. Malachi Bonds. Does not like water. She will have 1 glass white wine daily.   Seen by dermatology. She has had a past of severe itching. She is keeping her nails trimmed short. She has tried many lotions but none work.   States they are current on all immunizations.   Wants flu vaccination today  Dr. Bing Plume is her eye doctor- next appointment next month   History of recurrent pneumonia with hospitalization  Saw dentist last week, Dr. Otila Kluver   Sleeping patterns are fluctuating. She sleeps more during day and is up at night.    Past Medical History:  Diagnosis Date  . Memory changes      reports that she has never smoked. She has never used smokeless tobacco. She reports current alcohol use. She reports that she does not use drugs. Social History   Socioeconomic History  . Marital status: Widowed    Spouse name: Not on file  . Number of children: Not on file  . Years of education: Not on file  .  Highest education level: Not on file  Occupational History  . Not on file  Social Needs  . Financial resource strain: Not on file  . Food insecurity    Worry: Not on file    Inability: Not on file  . Transportation needs    Medical: Not on file    Non-medical: Not on file  Tobacco Use  . Smoking status: Never Smoker  . Smokeless tobacco: Never Used  Substance and Sexual Activity  . Alcohol use: Yes    Comment: wine  . Drug use: Never  . Sexual activity: Not on file  Lifestyle  . Physical activity    Days per week: Not on file    Minutes per session: Not on file  . Stress: Not on file  Relationships  . Social  Herbalist on phone: Not on file    Gets together: Not on file    Attends religious service: Not on file    Active member of club or organization: Not on file    Attends meetings of clubs or organizations: Not on file    Relationship status: Not on file  . Intimate partner violence    Fear of current or ex partner: Not on file    Emotionally abused: Not on file    Physically abused: Not on file    Forced sexual activity: Not on file  Other Topics Concern  . Not on file  Social History Narrative  . Not on file    Functional Status Survey:    Family History  Problem Relation Age of Onset  . Diabetes Father   . Diabetes Brother     Health Maintenance  Topic Date Due  . Samul Dada  01/30/1945  . DEXA SCAN  01/30/1993  . PNA vac Low Risk Adult (1 of 2 - PCV13) 01/30/1993  . INFLUENZA VACCINE  07/21/2019    No Known Allergies  Outpatient Encounter Medications as of 10/01/2019  Medication Sig  . atorvastatin (LIPITOR) 10 MG tablet Take 1 tablet by mouth daily.  . citalopram (CELEXA) 10 MG tablet Take 1 tablet by mouth daily.  Marland Kitchen donepezil (ARICEPT) 5 MG tablet Take 1 tablet by mouth daily.  Marland Kitchen venlafaxine XR (EFFEXOR-XR) 75 MG 24 hr capsule Take 1 capsule by mouth daily.  . Vitamin D, Ergocalciferol, (DRISDOL) 1.25 MG (50000 UT) CAPS capsule Take 50,000 Units by mouth daily.   No facility-administered encounter medications on file as of 10/01/2019.     Review of Systems  Constitutional: Negative for activity change, appetite change, fatigue and fever.  HENT: Negative for dental problem, hearing loss and trouble swallowing.   Eyes: Positive for discharge. Negative for photophobia and visual disturbance.       Wears glasses, right eye dryness  Respiratory: Negative for cough, shortness of breath and wheezing.   Cardiovascular: Negative for chest pain, palpitations and leg swelling.  Gastrointestinal: Negative for abdominal pain, constipation, diarrhea and  nausea.  Endocrine: Negative for polydipsia, polyphagia and polyuria.  Genitourinary: Positive for frequency. Negative for dysuria, hematuria, vaginal bleeding and vaginal discharge.       Urinary leakage  Musculoskeletal: Negative for arthralgias and joint swelling.  Skin:       Dryness, seborrheic keratosis  Neurological: Negative for dizziness, weakness and headaches.  Hematological: Bruises/bleeds easily.  Psychiatric/Behavioral: Positive for dysphoric mood. Negative for sleep disturbance. The patient is not nervous/anxious.     Vitals:   10/01/19 1411  BP: 124/80  Pulse: 76  Resp: 18  Temp: 97.8 F (36.6 C)  SpO2: 97%  Weight: 149 lb 6.4 oz (67.8 kg)  Height: 5\' 6"  (1.676 m)   Body mass index is 24.11 kg/m. Physical Exam Vitals signs reviewed.  Constitutional:      General: She is not in acute distress.    Appearance: Normal appearance. She is normal weight.  HENT:     Head: Normocephalic.     Right Ear: There is impacted cerumen.     Left Ear: There is impacted cerumen.     Mouth/Throat:     Mouth: Mucous membranes are moist.     Pharynx: No posterior oropharyngeal erythema.  Eyes:     General:        Right eye: No discharge.        Left eye: No discharge.     Extraocular Movements: Extraocular movements intact.     Pupils: Pupils are equal, round, and reactive to light.  Neck:     Musculoskeletal: Normal range of motion.     Thyroid: No thyroid mass, thyromegaly or thyroid tenderness.   Cardiovascular:     Rate and Rhythm: Normal rate and regular rhythm.     Pulses: Normal pulses.     Heart sounds: Normal heart sounds. No murmur.  Pulmonary:     Effort: Pulmonary effort is normal. No respiratory distress.     Breath sounds: Normal breath sounds. No wheezing.  Abdominal:     General: Abdomen is flat. Bowel sounds are normal. There is no distension.     Palpations: Abdomen is soft.  Musculoskeletal:     Right lower leg: Edema present.     Left lower  leg: Edema present.     Comments: Kyphosis of the upper back present.    Lymphadenopathy:     Cervical: No cervical adenopathy.  Skin:    General: Skin is warm and dry.     Capillary Refill: Capillary refill takes less than 2 seconds.       Neurological:     General: No focal deficit present.     Mental Status: She is alert and oriented to person, place, and time. Mental status is at baseline.     Sensory: No sensory deficit.     Motor: No weakness.     Coordination: Coordination normal.     Gait: Gait normal.     Deep Tendon Reflexes: Reflexes normal.  Psychiatric:        Mood and Affect: Mood normal.        Speech: Speech normal.        Behavior: Behavior normal.        Thought Content: Thought content normal.        Cognition and Memory: She exhibits impaired recent memory and impaired remote memory.        Judgment: Judgment normal.     Labs reviewed: Basic Metabolic Panel: No results for input(s): NA, K, CL, CO2, GLUCOSE, BUN, CREATININE, CALCIUM, MG, PHOS in the last 8760 hours. Liver Function Tests: No results for input(s): AST, ALT, ALKPHOS, BILITOT, PROT, ALBUMIN in the last 8760 hours. No results for input(s): LIPASE, AMYLASE in the last 8760 hours. No results for input(s): AMMONIA in the last 8760 hours. CBC: No results for input(s): WBC, NEUTROABS, HGB, HCT, MCV, PLT in the last 8760 hours. Cardiac Enzymes: No results for input(s): CKTOTAL, CKMB, CKMBINDEX, TROPONINI in the last 8760 hours. BNP: Invalid input(s): POCBNP No results found for: HGBA1C No results found for:  TSH No results found for: VITAMINB12 No results found for: FOLATE No results found for: IRON, TIBC, FERRITIN  Imaging and Procedures obtained prior to SNF admission: Ct Head Wo Contrast  Result Date: 08/05/2007 Clinical Data: [place] hitting face on concrete with laceration and swelling of right orbit. HEAD CT WITHOUT CONTRAST: Technique: Contiguous axial images were obtained from the base of  the skull through the vertex according to standard protocol without contrast. Comparison: None. Findings: The ventricular system is normal in size and configuration for age and the septum is midline in position. Diffuse cortical atrophy is noted. No blood, edema, or mass effect is seen. On bone window images, an air-fluid level is noted within the right maxillary sinus. No calvarial fracture is seen. IMPRESSION: Mild cortical atrophy. No acute intracranial abnormality. Air-fluid level in right maxillary sinus. CERVICAL SPINE CT WITHOUT CONTRAST: Technique: Multidetector CT imaging of the cervical spine was performed. Multiplanar CT image reconstructions were also generated. Findings: The cervical vertebrae are in normal alignment with normal disc spaces. No prevertebral soft tissue swelling is seen. The odontoid process is intact. The posterior elements are intact. Minimal central disc protrusion is noted at the C4-5 level. IMPRESSION: Normal alignment with no acute abnormality. No evidence of cervical spine fracture or subluxation. CT OF THE ORBITS WITHOUT CONTRAST: Technique: Axial and coronal plane CT imaging was performed through the orbits. No intravenous contrast was administered. Findings: The orbital rims are intact. There is fluid layering in the right maxillary sinus. The mandibular condyles are in normal position. The nasal spine is intact. Zygomatic arches are intact. IMPRESSION: No acute fracture on CT of the orbits. There is fluid layering in the right maxillary sinus. Provider: Genia Harold  Ct Cervical Spine Wo Contrast  Result Date: 08/05/2007 Clinical Data: [place] hitting face on concrete with laceration and swelling of right orbit. HEAD CT WITHOUT CONTRAST: Technique: Contiguous axial images were obtained from the base of the skull through the vertex according to standard protocol without contrast. Comparison: None. Findings: The ventricular system is normal in size and configuration for age and  the septum is midline in position. Diffuse cortical atrophy is noted. No blood, edema, or mass effect is seen. On bone window images, an air-fluid level is noted within the right maxillary sinus. No calvarial fracture is seen. IMPRESSION: Mild cortical atrophy. No acute intracranial abnormality. Air-fluid level in right maxillary sinus. CERVICAL SPINE CT WITHOUT CONTRAST: Technique: Multidetector CT imaging of the cervical spine was performed. Multiplanar CT image reconstructions were also generated. Findings: The cervical vertebrae are in normal alignment with normal disc spaces. No prevertebral soft tissue swelling is seen. The odontoid process is intact. The posterior elements are intact. Minimal central disc protrusion is noted at the C4-5 level. IMPRESSION: Normal alignment with no acute abnormality. No evidence of cervical spine fracture or subluxation. CT OF THE ORBITS WITHOUT CONTRAST: Technique: Axial and coronal plane CT imaging was performed through the orbits. No intravenous contrast was administered. Findings: The orbital rims are intact. There is fluid layering in the right maxillary sinus. The mandibular condyles are in normal position. The nasal spine is intact. Zygomatic arches are intact. IMPRESSION: No acute fracture on CT of the orbits. There is fluid layering in the right maxillary sinus. Provider: debra shealy  Ct Orbit/temporal W/o Contrast  Result Date: 08/05/2007 Clinical Data: [place] hitting face on concrete with laceration and swelling of right orbit. HEAD CT WITHOUT CONTRAST: Technique: Contiguous axial images were obtained from the base of the skull  through the vertex according to standard protocol without contrast. Comparison: None. Findings: The ventricular system is normal in size and configuration for age and the septum is midline in position. Diffuse cortical atrophy is noted. No blood, edema, or mass effect is seen. On bone window images, an air-fluid level is noted within the  right maxillary sinus. No calvarial fracture is seen. IMPRESSION: Mild cortical atrophy. No acute intracranial abnormality. Air-fluid level in right maxillary sinus. CERVICAL SPINE CT WITHOUT CONTRAST: Technique: Multidetector CT imaging of the cervical spine was performed. Multiplanar CT image reconstructions were also generated. Findings: The cervical vertebrae are in normal alignment with normal disc spaces. No prevertebral soft tissue swelling is seen. The odontoid process is intact. The posterior elements are intact. Minimal central disc protrusion is noted at the C4-5 level. IMPRESSION: Normal alignment with no acute abnormality. No evidence of cervical spine fracture or subluxation. CT OF THE ORBITS WITHOUT CONTRAST: Technique: Axial and coronal plane CT imaging was performed through the orbits. No intravenous contrast was administered. Findings: The orbital rims are intact. There is fluid layering in the right maxillary sinus. The mandibular condyles are in normal position. The nasal spine is intact. Zygomatic arches are intact. IMPRESSION: No acute fracture on CT of the orbits. There is fluid layering in the right maxillary sinus. Provider: Genia Harold   Assessment/Plan 1. Seborrheic dermatitis of scalp - chronic, stable at this time - some signs of dryness near hairline - recommend washing hair only 2-3 times a week to prevent further dryness  2. 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  - discussed progression of disease with daughter - I believe she will eventually need skilled nursing unit  3. Excessive cerumen in ear canal,  bilateral - chronic - seen by ENT and had ears flushed two weeks ago - new cerumen buildup had already started - irrigate ears in office today - OTC debrox drops- 5 drops in both ears for one week at nighttime - schedule regular flushes with Mliss Sax or Katharine Look at [facility]  4. Depression, major, single episode, moderate (HCC) - stable, not progressive - suspect earlier reports of feeling sad and depression related to the effects of covid and isolation - adoption of her new dog has decreased her feeling depressed - continue current medication regimen  5. Seborrheic ketatoses - chronic,stable, seen by dermatology in the past for removal - patient states they will pick at sites making her at increased risk for infection - educated patient on not picking   6. Need for influenza vaccination - administer Fluad Quad (high dose 65+)  7. Fall, sequela - history of hip fracture in the past - does not use any ambulatory aids  - states she will lose balance from time to time - last fall did not result in injury - suspect dementia is contributing factor to mobility - Continue Aricept medication at this time  8. 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 Forde Dandy notes to see past preventative  measures - consider discussing DEXA scan next visit  10. 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 Intrinsic eczema - chronic, has tried many lotions and OTC hydrocortisone with no success - recommend keeping nails trimmed short - recommend using products with ceramides to keep skin moist - apply lotion after bathing  12. ACP (advance care planning) - discussions with patient and daughter/ HPOA on DNR and MOST form - DNR and MOST form completed - directions to put DNR and MOST form in  designated tube  Family/ staff Communication:   Labs/tests ordered:complete blood count with differential/platelets, lipid panel, basic metabolic panel, hemoglobin A1C, hepatic function panel, vitamin D 25 hydroxy- future  Follow up: 2 month follow up

## 2019-10-01 NOTE — Patient Instructions (Signed)
Please pick up debrox drops to help moisten the ear wax in both ears--5 drops in each ear nightly for a week.  Then get your ears flushed in [facility] with the clinic nurse, Mliss Sax or Katharine Look.  They will call you to arrange it.

## 2019-10-01 NOTE — Progress Notes (Deleted)
Provider:  Rexene Edison. Mariea Lyons, D.O., C.M.D. Location:   [facility]   Place of Service:    clinic Previous PCP: Chelsea Curry, DO Patient Care Team: Chelsea Curry DO as PCP - General (Geriatric Medicine)  Extended Emergency Contact Information Primary Emergency Contact: [name] Phone: [phone] Relation: None  Code Status: *** Goals of Care: Advanced Directive information Advanced Directives 10/01/2019  Does Patient Have a Medical Advance Directive? Yes  Type of Advance Directive Living will  Does patient want to make changes to medical advance directive? No - Patient declined    Chief Complaint  Patient presents with  . Establish Care    New patient transferring from Dr. Forde Dandy    HPI: Patient is a 85 y.o. female seen today to establish with [facility].  Records have been requested from   Past Medical History:  Diagnosis Date  . Memory changes    *** The histories are not reviewed yet. Please review them in the "History" navigator section and refresh this [place].  Social History   Socioeconomic History  . Marital status: Widowed    Spouse name: Not on file  . Number of children: Not on file  . Years of education: Not on file  . Highest education level: Not on file  Occupational History  . Not on file  Social Needs  . Financial resource strain: Not on file  . Food insecurity    Worry: Not on file    Inability: Not on file  . Transportation needs    Medical: Not on file    Non-medical: Not on file  Tobacco Use  . Smoking status: Never Smoker  . Smokeless tobacco: Never Used  Substance and Sexual Activity  . Alcohol use: Yes    Comment: wine  . Drug use: Never  . Sexual activity: Not on file  Lifestyle  . Physical activity    Days per week: Not on file    Minutes per session: Not on file  . Stress: Not on file  Relationships  . Social Herbalist on phone: Not on file    Gets together: Not on file    Attends religious  service: Not on file    Active member of club or organization: Not on file    Attends meetings of clubs or organizations: Not on file    Relationship status: Not on file  Other Topics Concern  . Not on file  Social History Narrative  . Not on file    reports that she has never smoked. She has never used smokeless tobacco. She reports current alcohol use. She reports that she does not use drugs.  Functional Status Survey:    Family History  Problem Relation Age of Onset  . Diabetes Father   . Diabetes Brother     Health Maintenance  Topic Date Due  . Samul Dada  01/30/1945  . DEXA SCAN  01/30/1993  . PNA vac Low Risk Adult (1 of 2 - PCV13) 01/30/1993  . INFLUENZA VACCINE  07/21/2019    No Known Allergies  Outpatient Encounter Medications as of 10/01/2019  Medication Sig  . atorvastatin (LIPITOR) 10 MG tablet Take 1 tablet by mouth daily.  . citalopram (CELEXA) 10 MG tablet Take 1 tablet by mouth daily.  Marland Kitchen donepezil (ARICEPT) 5 MG tablet Take 1 tablet by mouth daily.  Marland Kitchen venlafaxine XR (EFFEXOR-XR) 75 MG 24 hr capsule Take 1 capsule by mouth daily.  . Vitamin D, Ergocalciferol, (DRISDOL)  1.25 MG (50000 UT) CAPS capsule Take 50,000 Units by mouth daily.   No facility-administered encounter medications on file as of 10/01/2019.     ROS  Vitals:   10/01/19 1411  BP: 124/80  Pulse: 76  Resp: 18  Temp: 97.8 F (36.6 C)  SpO2: 97%  Weight: 149 lb 6.4 oz (67.8 kg)  Height: 5\' 6"  (1.676 m)   Body mass index is 24.11 kg/m. Physical Exam  Labs reviewed: Basic Metabolic Panel: No results for input(s): NA, K, CL, CO2, GLUCOSE, BUN, CREATININE, CALCIUM, MG, PHOS in the last 8760 hours. Liver Function Tests: No results for input(s): AST, ALT, ALKPHOS, BILITOT, PROT, ALBUMIN in the last 8760 hours. No results for input(s): LIPASE, AMYLASE in the last 8760 hours. No results for input(s): AMMONIA in the last 8760 hours. CBC: No results for input(s): WBC, NEUTROABS,  HGB, HCT, MCV, PLT in the last 8760 hours. Cardiac Enzymes: No results for input(s): CKTOTAL, CKMB, CKMBINDEX, TROPONINI in the last 8760 hours. BNP: Invalid input(s): POCBNP No results found for: HGBA1C No results found for: TSH No results found for: VITAMINB12 No results found for: FOLATE No results found for: IRON, TIBC, FERRITIN  Imaging and Procedures noted on new patient packet:  Assessment/Plan There are no diagnoses linked to this encounter.    Labs/tests ordered:

## 2019-10-03 ENCOUNTER — Other Ambulatory Visit: Payer: Self-pay | Admitting: Internal Medicine

## 2019-10-03 ENCOUNTER — Encounter: Payer: Self-pay | Admitting: Internal Medicine

## 2019-10-04 ENCOUNTER — Telehealth: Payer: Self-pay | Admitting: *Deleted

## 2019-10-04 MED ORDER — VENLAFAXINE HCL ER 37.5 MG PO CP24: 37.5000 mg | ORAL_CAPSULE | Freq: Every day | ORAL | 0 refills | Status: DC

## 2019-10-04 NOTE — Telephone Encounter (Signed)
Patient daughter, Lelon Frohlich called and wanted to let Dr. Mariea Clonts know that patient's pharmacy is [facility]. Updated Pharmacy.   Also, daughter stated that you are already aware of having to write a letter for FMLA Leave of Absence and the dates on the letter should be 09/20/19-10/05/19. She also stated that is needs to state that she will be coming back to [place] every 3-4 weeks to check in on her mother and monitor her progress.

## 2019-10-05 ENCOUNTER — Encounter: Payer: Self-pay | Admitting: *Deleted

## 2019-10-08 NOTE — Telephone Encounter (Signed)
Letter was printed already.  I will print again and sign and put on your desk.

## 2019-10-08 NOTE — Telephone Encounter (Signed)
Daughter notified. To call once letter is revised and ready.

## 2019-10-08 NOTE — Telephone Encounter (Signed)
Daughter called requesting either the old letter you wrote or the new one your going to write. Stated that she needs to go back to work. Please Advise.

## 2019-10-08 NOTE — Telephone Encounter (Signed)
Harvie Bridge, daughter called and stated that she needs a letter for [employer] for HR. Stated that she is not allowed to go back to work until she has the letter. Please Advise.

## 2019-10-08 NOTE — Telephone Encounter (Signed)
I had done one letter, then I got that message saying she wanted other information in it as below.  It needs to be revised at this point to include that information she requested afterward.

## 2019-10-09 ENCOUNTER — Encounter: Payer: Self-pay | Admitting: Internal Medicine

## 2019-10-09 NOTE — Telephone Encounter (Signed)
Rodena Piety refer to [employer], it appears patients daughter is following up via mychart vs returning call

## 2019-10-09 NOTE — Telephone Encounter (Signed)
LMOM that letter was ready for pickup, mailing or fax. Awaiting call back from daughter, [name].

## 2019-10-10 ENCOUNTER — Encounter: Payer: Self-pay | Admitting: Internal Medicine

## 2019-10-10 NOTE — Telephone Encounter (Signed)
Letter faxed secure fax number: [phone] Attn: Ane Payment, HR Daughter notified and agreed.

## 2019-10-11 NOTE — Telephone Encounter (Signed)
Patient Response sent to Gayland Curry, DO

## 2019-10-13 ENCOUNTER — Encounter: Payer: Self-pay | Admitting: Internal Medicine

## 2019-10-15 NOTE — Telephone Encounter (Signed)
Message routed to Reed, Tiffany L, DO  

## 2019-11-15 ENCOUNTER — Encounter: Payer: Self-pay | Admitting: Internal Medicine

## 2019-11-19 NOTE — Telephone Encounter (Signed)
Message routed to Reed, Tiffany L, DO  

## 2019-11-19 NOTE — Telephone Encounter (Signed)
Message routed to

## 2019-12-03 ENCOUNTER — Encounter: Payer: Self-pay | Admitting: Internal Medicine

## 2019-12-03 ENCOUNTER — Ambulatory Visit: Payer: Medicare Other | Admitting: Internal Medicine

## 2019-12-05 ENCOUNTER — Other Ambulatory Visit: Payer: Self-pay

## 2019-12-05 ENCOUNTER — Non-Acute Institutional Stay: Payer: Medicare Other | Admitting: Internal Medicine

## 2019-12-05 ENCOUNTER — Encounter: Payer: Self-pay | Admitting: Internal Medicine

## 2019-12-05 VITALS — BP 130/80 | HR 78 | Temp 98.0°F | Ht 66.0 in | Wt 148.0 lb

## 2019-12-05 DIAGNOSIS — L219 Seborrheic dermatitis, unspecified: Secondary | ICD-10-CM | POA: Diagnosis not present

## 2019-12-05 DIAGNOSIS — F321 Major depressive disorder, single episode, moderate: Secondary | ICD-10-CM

## 2019-12-05 DIAGNOSIS — M810 Age-related osteoporosis without current pathological fracture: Secondary | ICD-10-CM

## 2019-12-05 DIAGNOSIS — Z7184 Encounter for health counseling related to travel: Secondary | ICD-10-CM

## 2019-12-05 DIAGNOSIS — G301 Alzheimer's disease with late onset: Secondary | ICD-10-CM

## 2019-12-05 DIAGNOSIS — F0280 Dementia in other diseases classified elsewhere without behavioral disturbance: Secondary | ICD-10-CM

## 2019-12-05 NOTE — Progress Notes (Signed)
Location:  Occupational psychologist of Service:  Clinic (12)  Provider: Uriyah Massimo L. Mariea Clonts, D.O., C.M.D.  Goals of Care:  Advanced Directives 10/01/2019  Does Patient Have a Medical Advance Directive? Yes  Type of Advance Directive Living will  Does patient want to make changes to medical advance directive? No - Patient declined   Chief Complaint  Patient presents with  . Acute Visit    need letter to travel with pet    HPI: Patient is a 85 y.o. female seen today for medical management of chronic diseases and acute due to need for letter to travel with her chihauhua to [place] to visit her daughter for Christmas.      She managed to lock herself out today coming over here and a neighbor had to bring her.  Leaves next week to visit her daughter and her son in law will travel with her.  She has her dog as emotional support pet.  She tells me today more about her depression history.  Both of her children are adopted.  Her husband was an identical twin and she wonders if there was something in his genetics that made him infertile.  She could never have her own children as a result and he was sorry for that in his last days, he told her.  She tells me she adopted [name] and also adopted a son.  The son, unfortunately, overdosed on opioid pain medication (oxycodone, she thinks).  She does not recall details of how he came upon the medication.  She sort of brushes this off like it does not affect her as she talks. This was not very recent, but she cannot tell me when at present.  Her balance is not great.  She's taking precautions.  She is not using an assistive device.  She is interested in PT at my recommendation when she returns from [facility].  I'd previously ordered it but she did not follow through.  Not hurting right now at all by her report.    Sleeps fine.  She unfortunately sleeps more in day than night.  She snoozes in the day when she sits down.  She would like to get into a better  routine.  She was more active pre-pandemic--was playing cards and doing things with other people before.  She is even bored with her books anymore.  Misses having meals with others.  I encouraged more physical activity to make her sleepier at night.  Tells me she does not recall anything from the visit she had with me when he daughter came 10/01/19.  Past Medical History:  Diagnosis Date  . Memory changes     Past Surgical History:  Procedure Laterality Date  . JOINT REPLACEMENT  2006 -2007   HIP    No Known Allergies  Outpatient Encounter Medications as of 12/05/2019  Medication Sig  . atorvastatin (LIPITOR) 10 MG tablet Take 1 tablet by mouth daily.  . citalopram (CELEXA) 10 MG tablet Take 1 tablet by mouth daily.  Marland Kitchen donepezil (ARICEPT) 5 MG tablet Take 1 tablet by mouth daily.  Marland Kitchen venlafaxine XR (EFFEXOR XR) 37.5 MG 24 hr capsule Take 1 capsule (37.5 mg total) by mouth daily with breakfast.  . Vitamin D, Ergocalciferol, (DRISDOL) 1.25 MG (50000 UT) CAPS capsule Take 50,000 Units by mouth daily.   No facility-administered encounter medications on file as of 12/05/2019.    Review of Systems:  Review of Systems  Constitutional: Positive for malaise/fatigue. Negative for  chills and fever.  HENT: Negative for congestion and sore throat.   Eyes:       Glasses  Respiratory: Negative for cough and shortness of breath.   Cardiovascular: Negative for chest pain, palpitations and leg swelling.  Gastrointestinal: Negative for abdominal pain, constipation, diarrhea, nausea and vomiting.  Genitourinary: Negative for dysuria.  Musculoskeletal: Positive for falls. Negative for back pain, joint pain, myalgias and neck pain.  Skin: Positive for rash.       Did not c/o scalp this time, but still does appear dry  Neurological: Positive for dizziness. Negative for loss of consciousness.  Endo/Heme/Allergies: Bruises/bleeds easily.  Psychiatric/Behavioral: Positive for depression and memory  loss. The patient is nervous/anxious. The patient does not have insomnia.     Health Maintenance  Topic Date Due  . TETANUS/TDAP  01/30/1945  . DEXA SCAN  01/30/1993  . PNA vac Low Risk Adult (1 of 2 - PCV13) 01/30/1993  . INFLUENZA VACCINE  Completed    Physical Exam: Vitals:   12/05/19 1126  BP: 130/80  Pulse: 78  Temp: 98 F (36.7 C)  TempSrc: Oral  SpO2: 96%  Weight: 148 lb (67.1 kg)  Height: 5\' 6"  (1.676 m)   Body mass index is 23.89 kg/m. Physical Exam Vitals reviewed.  Constitutional:      General: She is not in acute distress.    Appearance: Normal appearance. She is normal weight. She is not toxic-appearing.  HENT:     Head: Normocephalic and atraumatic.  Eyes:     Extraocular Movements: Extraocular movements intact.     Pupils: Pupils are equal, round, and reactive to light.     Comments: glasses  Cardiovascular:     Rate and Rhythm: Normal rate and regular rhythm.     Pulses: Normal pulses.     Heart sounds: Normal heart sounds.  Pulmonary:     Effort: Pulmonary effort is normal. No respiratory distress.     Breath sounds: Normal breath sounds. No wheezing, rhonchi or rales.  Abdominal:     General: Bowel sounds are normal.  Musculoskeletal:        General: Normal range of motion.     Right lower leg: No edema.     Left lower leg: No edema.  Skin:    General: Skin is warm and dry.  Neurological:     General: No focal deficit present.     Mental Status: She is alert and oriented to person, place, and time.     Cranial Nerves: No cranial nerve deficit.     Gait: Gait abnormal.     Comments: See hpi; struggled with giving some history, friend helped her get to clinic and security was escorting back  Psychiatric:        Mood and Affect: Mood normal.     Labs reviewed: Basic Metabolic Panel: Recent Labs    08/08/19 0000  NA 137  K 4.5  BUN 14  CREATININE 0.8   Liver Function Tests: Recent Labs    08/08/19 0000  AST 16  ALT 9  ALKPHOS  96   No results for input(s): LIPASE, AMYLASE in the last 8760 hours. No results for input(s): AMMONIA in the last 8760 hours. CBC: Recent Labs    08/08/19 0000  WBC 7.3  HGB 13.7  HCT 43  PLT 351   Lipid Panel: Recent Labs    08/08/19 0000  CHOL 231*  HDL 59  LDLCALC 150  TRIG 112   Lab Results  Component Value Date   HGBA1C 5.7 08/08/2019    Assessment/Plan 1. Late onset Alzheimer's disease without behavioral disturbance ([place]) -continues on aricept 5mg  which she appears to be tolerating -we can attempt to increase to 10mg  when she returns from [facility] since making a change to her regimen at this point when no one is here to adjust her pills will not work -she would benefit from a move to [place]  2. Depression, major, single episode, moderate ([place]) -seems to remain a factor, but I did see some improvement today vs last visit--she was more interactive with me -favor continuing to wean effexor, as well, but, again, did not change since she was here alone  3. Senile osteoporosis -Vitamin D2 should be weekly not daily--we will need to confirm with her daughter that she's actually on this dose and the frequency  4. Seborrheic dermatitis of scalp -cont mgt as discussed last time  5. Counseling about travel -letter done to travel with her dog for emotional support -she was also counseled about COVID-19 prevention with masking, social distancing and hand washing since they've elected to travel amid pandemic  Labs/tests ordered: no new Next appt: 01/02/2020 hopefully when her daughter is here visiting with her so we can make med adjustments and plans for her future needs    Charlen Bakula L. Jeroline Wolbert, D.O. [facility] [address] [place],  [address] Cell Phone (Mon-Fri 8am-5pm):  [phone] On Call:  [phone] & follow prompts after 5pm & weekends Office Phone:  [phone] Office Fax:  [phone]

## 2019-12-11 ENCOUNTER — Encounter: Payer: Self-pay | Admitting: Internal Medicine

## 2019-12-20 ENCOUNTER — Encounter: Payer: Self-pay | Admitting: Internal Medicine

## 2019-12-20 DIAGNOSIS — D229 Melanocytic nevi, unspecified: Secondary | ICD-10-CM

## 2019-12-22 ENCOUNTER — Encounter (HOSPITAL_COMMUNITY): Payer: Self-pay

## 2019-12-22 ENCOUNTER — Ambulatory Visit (HOSPITAL_COMMUNITY)
Admission: EM | Admit: 2019-12-22 | Discharge: 2019-12-22 | Disposition: A | Discharge status: Home / Self Care | Payer: Medicare Other | Attending: Family Medicine | Admitting: Family Medicine

## 2019-12-22 ENCOUNTER — Other Ambulatory Visit: Payer: Self-pay

## 2019-12-22 DIAGNOSIS — N3090 Cystitis, unspecified without hematuria: Secondary | ICD-10-CM | POA: Diagnosis present

## 2019-12-22 LAB — POCT URINALYSIS DIP (DEVICE)
Glucose, UA: 250 mg/dL — AB
Ketones, ur: 40 mg/dL — AB
Nitrite: POSITIVE — AB
Protein, ur: >=300 mg/dL — AB
Specific Gravity, Urine: 1.01 (ref 1.005–1.030)
Urobilinogen, UA: >=8 mg/dL (ref 0.0–1.0)
pH: 8.5 — ABNORMAL HIGH (ref 5.0–8.0)

## 2019-12-22 MED ORDER — CEPHALEXIN 500 MG PO CAPS: 500.0000 mg | ORAL_CAPSULE | Freq: Two times a day (BID) | ORAL | 0 refills | Status: DC

## 2019-12-22 MED ORDER — PHENAZOPYRIDINE HCL 200 MG PO TABS: 200.0000 mg | ORAL_TABLET | Freq: Three times a day (TID) | ORAL | 0 refills | Status: DC

## 2019-12-22 NOTE — ED Triage Notes (Signed)
Pt present urinary frequency with some burning while urinating. Pt states she feel a lot of pressure in her bladder

## 2019-12-22 NOTE — ED Provider Notes (Signed)
[place]    ASSESSMENT & PLAN:  1. Cystitis     Begin: Meds ordered this encounter  Medications  . cephALEXin (KEFLEX) 500 MG capsule    Sig: Take 1 capsule (500 mg total) by mouth 2 (two) times daily.    Dispense:  10 capsule    Refill:  0  . phenazopyridine (PYRIDIUM) 200 MG tablet    Sig: Take 1 tablet (200 mg total) by mouth 3 (three) times daily.    Dispense:  6 tablet    Refill:  0    No signs of pyelonephritis. Discussed. Urine culture sent. Will notify patient when results available. Will follow up with her PCP or here if not showing improvement over the next 48 hours, sooner if needed.  Outlined signs and symptoms indicating need for more acute intervention. Patient verbalized understanding. After Visit Summary given.  SUBJECTIVE: History from daughter and mother. [name] is a 86 y.o. female who complains of urinary urgency for a few days and occasional dysuria for the past 1 day. Without associated flank pain, fever, chills, vaginal discharge or bleeding. Gross hematuria: present. No specific aggravating or alleviating factors reported. No LE edema. Normal PO intake without n/v/d. Without specific abdominal pain. Ambulatory without difficulty. OTC treatment: none. H/O UTI: very distant past.  ROS: As in HPI.  OBJECTIVE:  Vitals:   12/22/19 1251  BP: 139/73  Pulse: 92  Resp: 16  Temp: 97.8 F (36.6 C)  TempSrc: Oral  SpO2: 99%   General appearance: alert; no distress HENT: oropharynx: moist Lungs: unlabored respirations Abdomen: soft, no pain reported Back: no CVA tenderness reported Extremities: no edema; symmetrical with no gross deformities Skin: warm and dry Neurologic: normal gait Psychological: alert and cooperative; normal mood and affect  Labs Reviewed  POCT URINALYSIS DIP (DEVICE) - Abnormal; Notable for the following components:      Result Value   Glucose, UA 250 (*)    Bilirubin Urine LARGE (*)    Ketones,  ur 40 (*)    Hgb urine dipstick LARGE (*)    pH 8.5 (*)    Protein, ur >=300 (*)    Nitrite POSITIVE (*)    Leukocytes,Ua LARGE (*)    All other components within normal limits  URINE CULTURE    Allergies  Allergen Reactions  . Antihistamines, Chlorpheniramine-Type     Past Medical History:  Diagnosis Date  . Adult failure to thrive   . Cold hands   . Colon polyps   . Dementia, senile ([place])   . Depression   . Depression with anxiety   . Excessive ear wax   . Hearing decreased, bilateral   . Hyperlipemia   . Impaired fasting glucose   . Memory changes   . Memory loss   . Osteoporosis   . Vitamin D deficiency    Social History   Socioeconomic History  . Marital status: Widowed    Spouse name: Not on file  . Number of children: Not on file  . Years of education: Not on file  . Highest education level: Not on file  Occupational History  . Not on file  Tobacco Use  . Smoking status: Never Smoker  . Smokeless tobacco: Never Used  Substance and Sexual Activity  . Alcohol use: Yes    Comment: wine  . Drug use: Never  . Sexual activity: Not on file  Other Topics Concern  . 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     Social Needs       Financial resource strain: Not on file       Food insecurity         Worry: Not on file         Inability: Not on file       Transportation needs         Medical: Not on file         Non-medical: Not on file     Tobacco Use       Smoking status: Never Smoker       Smokeless tobacco: Never Used     Substance and Sexual Activity       Alcohol use: Yes         Comment: wine       Drug use: Never       Sexual activity: Not on file     Lifestyle       Physical  activity         Days per week: Not on file         Minutes per session: Not on file       Stress: Not on file     Relationships       Social connections         Talks on phone: Not on file         Gets together: Not on file         Attends religious service: Not on file         Active member of club or organization: Not on file         Attends meetings of clubs or organizations: Not on file         Relationship status: Not on file       Intimate partner violence         Fear of current or ex partner: Not on file         Emotionally abused: Not on file         Physically abused: Not on file         Forced sexual activity: Not on file     Other Topics       Concerns:         Not on file     Social History Narrative       Diet: Good   Do you drink/eat things with caffeine? Yes   Lives in a 1 story apartment at [employer].   Do you have any pets in your home yes (Buddy).   Do  you exercise? Walk with dog daily      Social Determinants of Health   Financial Resource Strain:   . Difficulty of Paying Living Expenses: Not on file  Food Insecurity:   . Worried About Charity fundraiser in the Last Year: Not on file  . Ran Out of Food in the Last Year: Not on file  Transportation Needs:   . Lack of Transportation (Medical): Not on file  .  Lack of Transportation (Non-Medical): Not on file  Physical Activity:   . Days of Exercise per Week: Not on file  . Minutes of Exercise per Session: Not on file  Stress:   . Feeling of Stress : Not on file  Social Connections:   . Frequency of Communication with Friends and Family: Not on file  . Frequency of Social Gatherings with Friends and Family: Not on file  . Attends Religious Services: Not on file  . Active Member of Clubs or Organizations: Not on file  . Attends Archivist Meetings: Not on file  . Marital Status: Not on file  Intimate Partner Violence:   . Fear of Current or Ex-Partner: Not on file  . Emotionally  Abused: Not on file  . Physically Abused: Not on file  . Sexually Abused: Not on file   Family History  Problem Relation Age of Onset  . Diabetes Father   . Diabetes Brother        Vanessa Kick, MD 12/22/19 1356

## 2019-12-23 LAB — URINE CULTURE: Culture: <10000 — AB

## 2019-12-24 ENCOUNTER — Encounter: Payer: Self-pay | Admitting: Internal Medicine

## 2019-12-24 NOTE — Telephone Encounter (Signed)
Reply from patient/family member routed to [employer] L, DO

## 2019-12-24 NOTE — Telephone Encounter (Signed)
Message sent from Dr.Reed via staff message (not documented in patients chart)  [facility] for bloody painful moles please. Pt will need [facility] if her daughter is not here with her to take her. Also, handicapped plaquard can be done, but patient herself should not be driving at all.   Referral order pending for Dr.Reed to review and sign (mark urgent if necessary). Response sent back to patients daughter

## 2019-12-24 NOTE — Telephone Encounter (Signed)
Spoke with Janett Billow at [employer] and she already gave this information to the patient's daughter. On-site Dermatology will not come out until 01/08/2020, did you want pt to wait that long??? Please advise

## 2019-12-24 NOTE — Telephone Encounter (Signed)
Reed, Tiffany L, DO  You 15 minutes ago (11:04 AM)   Can we please call over the the [facility] front desk, Janett Billow, and get [name] an appt with dermatology next time they come? Also, please make Bernadette/Sandra aware so they can remind her b/c she is likely to forget the appt.

## 2019-12-26 ENCOUNTER — Encounter: Payer: Self-pay | Admitting: Internal Medicine

## 2019-12-26 NOTE — Telephone Encounter (Signed)
Patient's daughter called to check the status of handicap placard and referral.   Referral is still pending from 12/24/2019 awaiting for Dr.Reed's review and signature.

## 2020-01-01 ENCOUNTER — Encounter: Payer: Self-pay | Admitting: Internal Medicine

## 2020-01-02 ENCOUNTER — Other Ambulatory Visit: Payer: Self-pay

## 2020-01-02 ENCOUNTER — Encounter: Payer: Self-pay | Admitting: Internal Medicine

## 2020-01-02 ENCOUNTER — Non-Acute Institutional Stay: Payer: Medicare Other | Admitting: Internal Medicine

## 2020-01-02 VITALS — BP 128/68 | HR 76 | Temp 97.5°F | Ht 66.0 in | Wt 145.0 lb

## 2020-01-02 DIAGNOSIS — G301 Alzheimer's disease with late onset: Secondary | ICD-10-CM | POA: Diagnosis not present

## 2020-01-02 DIAGNOSIS — L821 Other seborrheic keratosis: Secondary | ICD-10-CM

## 2020-01-02 DIAGNOSIS — F0280 Dementia in other diseases classified elsewhere without behavioral disturbance: Secondary | ICD-10-CM

## 2020-01-02 NOTE — Patient Instructions (Signed)
Change the bandaid on your mole at your left waistline daily after bathing until it quits bleeding.  Reschedule the dermatology appointment you missed yesterday so they can remove that mole and possibly the one on the breast that bleeds sometimes.

## 2020-01-02 NOTE — Progress Notes (Signed)
Location:  [facility] of Service:  Clinic (12)  Provider: Persia Lintner L. Mariea Clonts, D.O., C.M.D.   Goals of Care: MOST on file in vynca with DNR Advanced Directives 01/02/2020  Does Patient Have a Medical Advance Directive? Yes  Type of Paramedic of [place];Living will  Does patient want to make changes to medical advance directive? No - Patient declined     Chief Complaint  Patient presents with  . Medical Management of Chronic Issues    1 month follow up, Check bleeding moles.     HPI: Patient is a 86 y.o. female with dementia, senile osteoporosis, multiple seborrheic keratoses, kyphosis, frequent falls, dry eyes and depression  seen today for an acute visit for bleeding mole on her left side at her waistline.  She reports that one and the one on the bottom side of her left breast will get irritated and bleed at times.  She apparently was having quite a bit of bleeding that made her daughter concerned.  There are no signs of infection, no pain and no other drainage outside of a small amount of blood on the one at the waistline on her depend and the toilet paper she had stuffed b/w her depends and her skin.  She has numerous seborrheic keratoses on her chest, abdomen and back.    Letter was done for her daughter being out of work to help get her placed in AL--given to patient in an envelope which she put inside her rollator.  Past Medical History:  Diagnosis Date  . Adult failure to thrive   . Cold hands   . Colon polyps   . Dementia, senile ([place])   . Depression   . Depression with anxiety   . Excessive ear wax   . Hearing decreased, bilateral   . Hyperlipemia   . Impaired fasting glucose   . Memory changes   . Memory loss   . Osteoporosis   . Vitamin D deficiency     Past Surgical History:  Procedure Laterality Date  . JOINT REPLACEMENT  2006 -2007   HIP    Allergies  Allergen Reactions  . Antihistamines, Chlorpheniramine-Type      Outpatient Encounter Medications as of 01/02/2020  Medication Sig  . atorvastatin (LIPITOR) 10 MG tablet Take 1 tablet by mouth daily.  . cephALEXin (KEFLEX) 500 MG capsule Take 1 capsule (500 mg total) by mouth 2 (two) times daily.  . citalopram (CELEXA) 10 MG tablet Take 1 tablet by mouth daily.  Marland Kitchen donepezil (ARICEPT) 5 MG tablet Take 1 tablet by mouth daily.  . phenazopyridine (PYRIDIUM) 200 MG tablet Take 1 tablet (200 mg total) by mouth 3 (three) times daily.  Marland Kitchen venlafaxine XR (EFFEXOR XR) 37.5 MG 24 hr capsule Take 1 capsule (37.5 mg total) by mouth daily with breakfast.  . Vitamin D, Ergocalciferol, (DRISDOL) 1.25 MG (50000 UT) CAPS capsule Take 50,000 Units by mouth once a week.   No facility-administered encounter medications on file as of 01/02/2020.    Review of Systems:  Review of Systems  Constitutional: Negative for chills, fever and malaise/fatigue.  Eyes: Negative for blurred vision.  Respiratory: Negative for cough and shortness of breath.   Cardiovascular: Negative for chest pain, palpitations and leg swelling.  Gastrointestinal: Negative for abdominal pain.  Genitourinary: Negative for dysuria.       Uses depends  Musculoskeletal: Positive for back pain and falls.       Kyphosis  Skin:  Multiple "moles"  Neurological: Negative for dizziness and loss of consciousness.  Endo/Heme/Allergies: Bruises/bleeds easily.  Psychiatric/Behavioral: Positive for depression and memory loss.    Health Maintenance  Topic Date Due  . TETANUS/TDAP  01/30/1945  . DEXA SCAN  01/30/1993  . PNA vac Low Risk Adult (2 of 2 - PCV13) 09/19/2013  . INFLUENZA VACCINE  Completed    Physical Exam: Vitals:   01/02/20 1312  BP: 128/68  Pulse: 76  Temp: (!) 97.5 F (36.4 C)  TempSrc: Temporal  SpO2: 97%  Weight: 145 lb (65.8 kg)  Height: 5\' 6"  (1.676 m)   Body mass index is 23.4 kg/m. Physical Exam Vitals reviewed.  Constitutional:      General: She is not in acute  distress.    Appearance: Normal appearance. She is not ill-appearing or toxic-appearing.  HENT:     Head: Normocephalic and atraumatic.  Cardiovascular:     Rate and Rhythm: Normal rate and regular rhythm.     Pulses: Normal pulses.     Heart sounds: Normal heart sounds.  Pulmonary:     Effort: Pulmonary effort is normal.     Breath sounds: Normal breath sounds.  Abdominal:     General: Bowel sounds are normal.     Palpations: Abdomen is soft.  Skin:    General: Skin is warm.     Comments: Multiple seborrheic keratoses of varying sizes and shades of peach/brown over her abdomen, breasts, back; one at her left baseline is irritated and bleeding onto a piece of toilet paper she put inside her depends and another she mentions that's bothersome is on the underside of her left breast (it's not actively bleeding though)  Neurological:     General: No focal deficit present.     Mental Status: She is alert.     Gait: Gait abnormal.     Labs reviewed: Basic Metabolic Panel: Recent Labs    08/08/19 0000  NA 137  K 4.5  BUN 14  CREATININE 0.8   Liver Function Tests: Recent Labs    08/08/19 0000  AST 16  ALT 9  ALKPHOS 96   No results for input(s): LIPASE, AMYLASE in the last 8760 hours. No results for input(s): AMMONIA in the last 8760 hours. CBC: Recent Labs    08/08/19 0000  WBC 7.3  HGB 13.7  HCT 43  PLT 351   Lipid Panel: Recent Labs    08/08/19 0000  CHOL 231*  HDL 59  LDLCALC 150  TRIG 112   Lab Results  Component Value Date   HGBA1C 5.7 08/08/2019    Procedures since last visit: No results found.  Assessment/Plan 1. Seborrheic keratoses -numerous with two that are in bad places and get irritated--may need the one on the underside of her left breast and the one at her waistline that is bleeding removed by the dermatologist--need to reschedule the appt since her daughter couldn't get her to get up to go yesterday -recommended applying a large bandaid  over the bleeding keratosis at her waistline daily after bathing--given 5 large bandaids to use  2. Late onset Alzheimer's disease without behavioral disturbance ([place]) -is moving into AL due to worsening cognition and will then f/u with me for her next routine visit in 3 months  Labs/tests ordered:  No new at this time; reschedule derm Next appt:  3 mos med mgt  Artha Chiasson L. Barney Russomanno, D.O. [facility] [address], [place] [address]  Cell Phone (Mon-Fri 8am-5pm):  [phone] On Call:  [phone] & follow prompts after 5pm & weekends Office Phone:  [phone] Office Fax:  [phone]

## 2020-01-21 ENCOUNTER — Encounter: Payer: Self-pay | Admitting: Internal Medicine

## 2020-01-21 DIAGNOSIS — R7301 Impaired fasting glucose: Secondary | ICD-10-CM | POA: Insufficient documentation

## 2020-01-21 DIAGNOSIS — R627 Adult failure to thrive: Secondary | ICD-10-CM | POA: Insufficient documentation

## 2020-01-21 DIAGNOSIS — E559 Vitamin D deficiency, unspecified: Secondary | ICD-10-CM | POA: Insufficient documentation

## 2020-01-21 DIAGNOSIS — F418 Other specified anxiety disorders: Secondary | ICD-10-CM | POA: Insufficient documentation

## 2020-01-21 DIAGNOSIS — E785 Hyperlipidemia, unspecified: Secondary | ICD-10-CM | POA: Insufficient documentation

## 2020-01-21 DIAGNOSIS — M810 Age-related osteoporosis without current pathological fracture: Secondary | ICD-10-CM | POA: Insufficient documentation

## 2020-01-21 DIAGNOSIS — J479 Bronchiectasis, uncomplicated: Secondary | ICD-10-CM | POA: Insufficient documentation

## 2020-01-21 NOTE — Progress Notes (Signed)
Records received and reviewed from Dr. Forde Dandy at [facility].  Added a few more historical surgeries to record that we did not have.  Cognitive loss had been "trivial" at that time.  Labs have been abstracted from august by [place].    Of note, pt needs prevnar, tdap and shingrix as these are not on file.  No evidence she's been on meds for her osteoporosis.  Has vitamin D deficiency, also.  Jozie Wulf L. Lyanne Kates, D.O. [facility] [address], [place] [address] Cell Phone (Mon-Fri 8am-5pm):  [phone] On Call:  [phone] & follow prompts after 5pm & weekends Office Phone:  [phone] Office Fax:  [phone]

## 2020-01-31 ENCOUNTER — Encounter: Payer: Self-pay | Admitting: Internal Medicine

## 2020-01-31 DIAGNOSIS — F321 Major depressive disorder, single episode, moderate: Secondary | ICD-10-CM

## 2020-02-06 MED ORDER — CITALOPRAM HYDROBROMIDE 10 MG PO TABS: 10.0000 mg | ORAL_TABLET | Freq: Every day | ORAL | 0 refills | Status: DC

## 2020-02-06 MED ORDER — CITALOPRAM HYDROBROMIDE 20 MG PO TABS: 20.0000 mg | ORAL_TABLET | Freq: Every day | ORAL | 5 refills | Status: DC

## 2020-02-06 MED ORDER — VENLAFAXINE HCL ER 37.5 MG PO CP24: 37.5000 mg | ORAL_CAPSULE | ORAL | 0 refills | Status: DC

## 2020-03-26 ENCOUNTER — Non-Acute Institutional Stay: Payer: Medicare Other | Admitting: Internal Medicine

## 2020-03-26 ENCOUNTER — Encounter: Payer: Self-pay | Admitting: Internal Medicine

## 2020-03-26 ENCOUNTER — Other Ambulatory Visit: Payer: Self-pay

## 2020-03-26 VITALS — BP 118/58 | HR 76 | Temp 97.7°F | Ht 66.0 in | Wt 153.0 lb

## 2020-03-26 DIAGNOSIS — M810 Age-related osteoporosis without current pathological fracture: Secondary | ICD-10-CM | POA: Diagnosis not present

## 2020-03-26 DIAGNOSIS — L2084 Intrinsic (allergic) eczema: Secondary | ICD-10-CM | POA: Diagnosis not present

## 2020-03-26 DIAGNOSIS — G301 Alzheimer's disease with late onset: Secondary | ICD-10-CM | POA: Diagnosis not present

## 2020-03-26 DIAGNOSIS — F0280 Dementia in other diseases classified elsewhere without behavioral disturbance: Secondary | ICD-10-CM

## 2020-03-26 DIAGNOSIS — M4010 Other secondary kyphosis, site unspecified: Secondary | ICD-10-CM

## 2020-03-26 DIAGNOSIS — F324 Major depressive disorder, single episode, in partial remission: Secondary | ICD-10-CM | POA: Diagnosis not present

## 2020-03-26 NOTE — Progress Notes (Signed)
Location:    [place] of Service:   clinic  Provider: Kaavya Puskarich L. Mariea Clonts, D.O., C.M.D.  Code Status: DNR Pt talked today about how she's had a good life and does not want anything done to prolong her life if her time comes.  She does not want to disappoint her daughter or grandchildren and she doesn't want her life to drag on if she is sick.  MOST is already on file with this wish.  Goals of Care:  Advanced Directives 03/26/2020  Does Patient Have a Medical Advance Directive? Yes  Type of Paramedic of [place];Living will;Out of facility DNR (pink MOST or yellow form)  Does patient want to make changes to medical advance directive? No - Patient declined  Copy of [place] in Chart? Yes - validated most recent copy scanned in chart (See row information)  Pre-existing out of facility DNR order (yellow form or pink MOST form) Pink MOST/Yellow Form most recent copy in chart - Physician notified to receive inpatient order     Chief Complaint  Patient presents with  . Medical Management of Chronic Issues    3 month follow up     HPI: Patient is a 86 y.o. female with h/o dementia [place], depression, falls, vitamin D deficiency, osteoporosis with kyphosis, seen today for medical management of chronic diseases.    She reports sleeping a lot  Misses her dog terribly which helped her get up and move around a lot.  Doesn't go to exercise--that's not her thing.  She used to play bridge, go to meals with people.  She reads, does crossword puzzles.    Mood is so so.  Can't say it's one way or another.  Feels a bit isolated.  Appetite is good.  May skip a meal sometimes.  Has her am coffee--eats at 9:30.  Sometimes skips lunch.  Has things in fridge for snacks in cases.  Then has dinner.  Weight has trended up since move and not having dog.  Not motivated to walk without having him to take out.    Back is doing ok.     Had both her covid vaccines.    Reviewing her records, it appears she's never had prevnar-13.  Unfortunately, we did not have a current one here and will need to give it next time.  Past Medical History:  Diagnosis Date  . Adult failure to thrive   . Bronchiectasis ([place])   . Cold hands   . Colon polyps   . Dementia, senile ([place])   . Depression   . Depression with anxiety   . Excessive ear wax   . Giant cell tumor   . Hearing decreased, bilateral   . Hyperlipemia   . Impaired fasting glucose   . Memory changes   . Memory loss   . Osteoporosis   . Pelvic fracture ([place])   . Vitamin D deficiency     Past Surgical History:  Procedure Laterality Date  . bilateral fallopian tubes and ovararian resection (benign)  06/07/2006  . CATARACT EXTRACTION, BILATERAL    . JOINT REPLACEMENT  2006 -2007   HIP    Allergies  Allergen Reactions  . Antihistamines, Chlorpheniramine-Type     Outpatient Encounter Medications as of 03/26/2020  Medication Sig  . atorvastatin (LIPITOR) 10 MG tablet Take 1 tablet by mouth daily.  . citalopram (CELEXA) 20 MG tablet Take 1 tablet (20 mg total) by  mouth daily.  Marland Kitchen donepezil (ARICEPT) 5 MG tablet Take 1 tablet by mouth daily.  . Vitamin D, Ergocalciferol, (DRISDOL) 1.25 MG (50000 UT) CAPS capsule Take 50,000 Units by mouth once a week.  . cephALEXin (KEFLEX) 500 MG capsule Take 1 capsule (500 mg total) by mouth 2 (two) times daily.  . citalopram (CELEXA) 10 MG tablet Take 1 tablet (10 mg total) by mouth daily for 14 days.  . phenazopyridine (PYRIDIUM) 200 MG tablet Take 1 tablet (200 mg total) by mouth 3 (three) times daily.  Marland Kitchen venlafaxine XR (EFFEXOR XR) 37.5 MG 24 hr capsule Take 1 capsule (37.5 mg total) by mouth every other day for 14 days.   No facility-administered encounter medications on file as of 03/26/2020.    Review of Systems:  Review of Systems  Constitutional: Positive for malaise/fatigue. Negative for chills and fever.  HENT:  Negative for congestion and sore throat.   Eyes:       Glasses  Respiratory: Negative for cough and shortness of breath.   Cardiovascular: Negative for chest pain, palpitations and leg swelling.  Gastrointestinal: Negative for abdominal pain, blood in stool, constipation, diarrhea and melena.  Genitourinary: Negative for dysuria.  Musculoskeletal: Negative for back pain, falls and joint pain.  Skin: Positive for itching. Negative for rash.       Dry skin, seborrheic keratoses, eczema  Neurological: Negative for dizziness and loss of consciousness.  Endo/Heme/Allergies: Bruises/bleeds easily.  Psychiatric/Behavioral: Positive for depression and memory loss. The patient is not nervous/anxious and does not have insomnia.        Mood improved since move to AL    Health Maintenance  Topic Date Due  . TETANUS/TDAP  Never done  . DEXA SCAN  Never done  . PNA vac Low Risk Adult (2 of 2 - PCV13) 09/19/2013  . INFLUENZA VACCINE  07/20/2020    Physical Exam: Vitals:   03/26/20 1130  Weight: 153 lb (69.4 kg)  Height: 5\' 6"  (1.676 m)   Body mass index is 24.69 kg/m. Physical Exam Constitutional:      General: She is not in acute distress.    Appearance: Normal appearance. She is not toxic-appearing.  HENT:     Head: Normocephalic and atraumatic.  Eyes:     Extraocular Movements: Extraocular movements intact.     Pupils: Pupils are equal, round, and reactive to light.     Comments: glasses  Cardiovascular:     Rate and Rhythm: Normal rate and regular rhythm.     Heart sounds: Murmur present.  Pulmonary:     Effort: Pulmonary effort is normal.     Breath sounds: Normal breath sounds. No wheezing, rhonchi or rales.  Musculoskeletal:        General: Normal range of motion.     Right lower leg: No edema.     Left lower leg: No edema.     Comments: kyphosis  Skin:    General: Skin is warm and dry.  Neurological:     General: No focal deficit present.     Mental Status: She is  alert and oriented to person, place, and time. Mental status is at baseline.     Comments: But did not recall her appt until she got her reminder call; ambulates with walker  Psychiatric:        Mood and Affect: Mood normal.     Labs reviewed: Basic Metabolic Panel: Recent Labs    08/08/19 0000  NA 137  K 4.5  BUN 14  CREATININE 0.8   Liver Function Tests: Recent Labs    08/08/19 0000  AST 16  ALT 9  ALKPHOS 96   No results for input(s): LIPASE, AMYLASE in the last 8760 hours. No results for input(s): AMMONIA in the last 8760 hours. CBC: Recent Labs    08/08/19 0000  WBC 7.3  HGB 13.7  HCT 43  PLT 351   Lipid Panel: Recent Labs    08/08/19 0000  CHOL 231*  HDL 59  LDLCALC 150  TRIG 112   Lab Results  Component Value Date   HGBA1C 5.7 08/08/2019    Procedures since last visit: No results found.  Assessment/Plan 1. Late onset Alzheimer's disease without behavioral disturbance ([facility]) -significant short-term memory loss, but doing better in assisted living--not exercising due to not walking her dog anymore (dog not with her now)  2. Senile osteoporosis -not on medications except weekly vitamin D3 and not exercising much -encouraged her to go to exercise classes, but she says it's not her thing  3. Kyphosis due to osteoporosis -ongoing, but no c/o pain   4. Depression, major, single episode, in partial remission ([place]) -doing better here, but still down at times due to "isolation" though they are not actually isolated anymore -cont celexa which has helped along with the move to AL  5. Intrinsic eczema -cont regular cream to keep skin moisturized after bathing--notes challenges opening bottles and jars--may benefit from device to do this from OT  Labs/tests ordered:  No new Next appt:  6 mos med mgt and for prevnar-13  [name] L. Saige [place], D.O. [facility] [address], [place] [address] Cell Phone  (Mon-Fri 8am-5pm):  [phone] On Call:  [phone] & follow prompts after 5pm & weekends Office Phone:  [phone] Office Fax:  [phone]

## 2020-05-07 ENCOUNTER — Encounter: Payer: Self-pay | Admitting: Internal Medicine

## 2020-05-07 NOTE — Telephone Encounter (Signed)
Message routed to PCP Reed, Tiffany L, DO . Please Advise.  

## 2020-05-09 NOTE — Telephone Encounter (Signed)
Ms.Anne, DR.Mariea Clonts is out of office today I'm covering for her.I can send an order to AL Nurse to increase Aricept from 5 mg tablet to 10 mg tablet as recommended by Dr.Reed. Thank You Trudy Kory FNP-C

## 2020-05-11 ENCOUNTER — Encounter: Payer: Self-pay | Admitting: Internal Medicine

## 2020-05-13 ENCOUNTER — Telehealth: Payer: Self-pay | Admitting: *Deleted

## 2020-05-13 NOTE — Telephone Encounter (Signed)
Received fax for [name] for Daughter, [name]. Requesting leave to help patient with Basic Medical, hygienic,nutritional and safety needs, Transportation, Physical Care, and Psychological Comfort for the month of June Employer: [employer]  Placed paperwork in Dr. Cyndi Lennert folder to review, fill out and sign.  Daughter wants to be notify once completed to pick up.

## 2020-05-24 ENCOUNTER — Encounter: Payer: Self-pay | Admitting: Internal Medicine

## 2020-05-26 ENCOUNTER — Encounter: Payer: Self-pay | Admitting: Internal Medicine

## 2020-05-28 ENCOUNTER — Other Ambulatory Visit: Payer: Self-pay

## 2020-05-28 ENCOUNTER — Encounter: Payer: Self-pay | Admitting: Internal Medicine

## 2020-05-28 ENCOUNTER — Ambulatory Visit: Payer: Medicare Other | Admitting: Internal Medicine

## 2020-05-28 VITALS — BP 118/63 | HR 62 | Temp 97.8°F | Ht 66.0 in | Wt 153.0 lb

## 2020-05-28 DIAGNOSIS — G301 Alzheimer's disease with late onset: Secondary | ICD-10-CM

## 2020-05-28 DIAGNOSIS — F0280 Dementia in other diseases classified elsewhere without behavioral disturbance: Secondary | ICD-10-CM | POA: Diagnosis not present

## 2020-05-28 DIAGNOSIS — Z7189 Other specified counseling: Secondary | ICD-10-CM

## 2020-05-28 DIAGNOSIS — F324 Major depressive disorder, single episode, in partial remission: Secondary | ICD-10-CM

## 2020-05-28 NOTE — Progress Notes (Signed)
Location:   [facility]    Place of Service:   clinic  Provider: Karol Skarzynski L. Mariea Clonts, D.O., C.M.D.  Code Status: DNR, MOST with DNR, comfort measures, do not prolong life, no abx, no fluids, no feeding tube  Goals of Care:  Advanced Directives 05/28/2020  Does Patient Have a Medical Advance Directive? Yes  Type of Advance Directive Out of facility DNR (pink MOST or yellow form)  Does patient want to make changes to medical advance directive? No - Guardian declined  Copy of [place] in Chart? -  Pre-existing out of facility DNR order (yellow form or pink MOST form) -     Chief Complaint  Patient presents with  . Medical Management of Chronic Issues    medication check     HPI: Patient is a 86 y.o. female seen today for medical management of chronic diseases.  Her daughter, [name], is visiting and was concerned that mom was sedated on the aricept which had been moved to the am due to poor sleep that I had thought might be due to nightmares.  She indicates, she is ready to go.  She'd like to die.  She's done everything she wants to do in life, had a great life.  She does not have reason to go on.  She does not want to hurt her family.  She does not want [place] to do anything to prolong her life.  Her daughter reports she has cut down to eating part of one meal per day only.  She continues to pick at her picker's nodules and her daughter has been applying a salve to them on her right arm.    We reviewed her meds and decided that considering her desire not to have her life prolonged, it certainly did not make sense to continue lipitor, aricept or vitamin D weekly.  She has no pain, no dyspnea, no increased anxiety.  Sleeps well now and has trouble getting up in the am--lack of motivation to get up or do anything.  Past Medical History:  Diagnosis Date  . Adult failure to thrive   . Bronchiectasis ([place])   . Cold hands   . Colon polyps   . Dementia, senile ([place])   . Depression    . Depression with anxiety   . Excessive ear wax   . Giant cell tumor   . Hearing decreased, bilateral   . Hyperlipemia   . Impaired fasting glucose   . Memory changes   . Memory loss   . Osteoporosis   . Pelvic fracture ([place])   . Vitamin D deficiency     Past Surgical History:  Procedure Laterality Date  . bilateral fallopian tubes and ovararian resection (benign)  06/07/2006  . CATARACT EXTRACTION, BILATERAL    . JOINT REPLACEMENT  2006 -2007   HIP    Allergies  Allergen Reactions  . Antihistamines, Chlorpheniramine-Type     Outpatient Encounter Medications as of 05/28/2020  Medication Sig  . atorvastatin (LIPITOR) 10 MG tablet Take 1 tablet by mouth daily.  . citalopram (CELEXA) 20 MG tablet Take 1 tablet (20 mg total) by mouth daily.  Marland Kitchen donepezil (ARICEPT) 5 MG tablet Take 1 tablet by mouth daily.  . Vitamin D, Ergocalciferol, (DRISDOL) 1.25 MG (50000 UNIT) CAPS capsule Take 50,000 Units by mouth every 7 (seven) days.   No facility-administered encounter medications on file as of 05/28/2020.    Review of Systems:  Review of Systems  Constitutional: Negative for chills  and fever.  Eyes: Negative for blurred vision.  Respiratory: Negative for cough and shortness of breath.   Cardiovascular: Negative for chest pain, palpitations and leg swelling.  Gastrointestinal: Negative for abdominal pain and constipation.  Genitourinary: Positive for urgency. Negative for dysuria.       OAB and incontinence, uses briefs and also has bms when urinates in commode  Musculoskeletal: Negative for falls and joint pain.  Skin: Negative for rash.       Picks at skin on arms  Neurological: Negative for dizziness and loss of consciousness.  Endo/Heme/Allergies: Does not bruise/bleed easily.  Psychiatric/Behavioral: Positive for depression and memory loss. Negative for suicidal ideas. The patient is not nervous/anxious and does not have insomnia.     Health Maintenance  Topic Date Due    . TETANUS/TDAP  Never done  . DEXA SCAN  Never done  . PNA vac Low Risk Adult (2 of 2 - PCV13) 09/19/2013  . INFLUENZA VACCINE  07/20/2020  . COVID-19 Vaccine  Completed    Physical Exam: Vitals:   05/28/20 0850  BP: 118/63  Pulse: 62  Temp: 97.8 F (36.6 C)  TempSrc: Temporal  SpO2: 93%  Weight: 153 lb (69.4 kg)  Height: 5\' 6"  (1.676 m)   Body mass index is 24.69 kg/m. Physical Exam Vitals reviewed.  Constitutional:      Comments: Drowsy but able to participate in visit  HENT:     Head: Normocephalic and atraumatic.  Cardiovascular:     Rate and Rhythm: Normal rate and regular rhythm.  Pulmonary:     Effort: Pulmonary effort is normal.     Breath sounds: Normal breath sounds.  Abdominal:     General: Bowel sounds are normal.  Skin:    General: Skin is warm and dry.     Comments: Thickening of skin on forearms from picking  Neurological:     General: No focal deficit present.     Cranial Nerves: No cranial nerve deficit.     Motor: Weakness present.     Gait: Gait abnormal.     Comments: Walks with walker  Psychiatric:     Comments: Somewhat flat affect, keeps repeating, I'm ready to go, I'm ready to die     Labs reviewed: Basic Metabolic Panel: Recent Labs    08/08/19 0000  NA 137  K 4.5  BUN 14  CREATININE 0.8   Liver Function Tests: Recent Labs    08/08/19 0000  AST 16  ALT 9  ALKPHOS 96   No results for input(s): LIPASE, AMYLASE in the last 8760 hours. No results for input(s): AMMONIA in the last 8760 hours. CBC: Recent Labs    08/08/19 0000  WBC 7.3  HGB 13.7  HCT 43  PLT 351   Lipid Panel: Recent Labs    08/08/19 0000  CHOL 231*  HDL 59  LDLCALC 150  TRIG 112   Lab Results  Component Value Date   HGBA1C 5.7 08/08/2019    Procedures since last visit: No results found.  Assessment/Plan 1. Late onset Alzheimer's disease without behavioral disturbance ([place]) -d/c aricept  2. Depression, major, single episode, active  ([place]) -she is ready to die, not suicidal, but does not want to keep going and does not want [place] doing anything to prolong her life -reviewed MOST--resident does not want any fluids or abx  3. ACP (advance care planning) -25 mins spent on ACP -MOST correct with no abx or fluids or hospitalization, pure comfort -refer to  hospice care--explained only possible eligibility at this time--may need to manage on our own since she's not showing weight loss and still participating in her own care, but getting a consult and eval still seems reasonable so she could easily be admitted if these things change in the near future--Anne should be called to set up the intake/assessment  Labs/tests ordered:  none Next appt:  09/24/2020  Areyana Leoni L. Inaara Tye, D.O. [facility] [address], [place] [address] Cell Phone (Mon-Fri 8am-5pm):  [phone] On Call:  [phone] & follow prompts after 5pm & weekends Office Phone:  [phone] Office Fax:  [phone]

## 2020-07-07 ENCOUNTER — Encounter: Payer: Self-pay | Admitting: Internal Medicine

## 2020-07-11 NOTE — Telephone Encounter (Signed)
Message Routed to Dr. Reed 

## 2020-07-18 NOTE — Telephone Encounter (Signed)
Message Routed to Dr. Reed MD 

## 2020-08-22 NOTE — Telephone Encounter (Signed)
New message received 08/22/20 routed to [employer], DO

## 2020-08-27 ENCOUNTER — Encounter: Payer: Self-pay | Admitting: Internal Medicine

## 2020-08-27 ENCOUNTER — Telehealth: Payer: Self-pay | Admitting: Internal Medicine

## 2020-08-27 ENCOUNTER — Non-Acute Institutional Stay: Payer: Medicare Other | Admitting: Internal Medicine

## 2020-08-27 ENCOUNTER — Other Ambulatory Visit: Payer: Self-pay

## 2020-08-27 VITALS — BP 118/62 | HR 69 | Temp 97.7°F | Ht 66.0 in | Wt 161.0 lb

## 2020-08-27 DIAGNOSIS — G301 Alzheimer's disease with late onset: Secondary | ICD-10-CM | POA: Diagnosis not present

## 2020-08-27 DIAGNOSIS — M810 Age-related osteoporosis without current pathological fracture: Secondary | ICD-10-CM

## 2020-08-27 DIAGNOSIS — Z23 Encounter for immunization: Secondary | ICD-10-CM

## 2020-08-27 DIAGNOSIS — F324 Major depressive disorder, single episode, in partial remission: Secondary | ICD-10-CM

## 2020-08-27 DIAGNOSIS — L2084 Intrinsic (allergic) eczema: Secondary | ICD-10-CM

## 2020-08-27 DIAGNOSIS — F0280 Dementia in other diseases classified elsewhere without behavioral disturbance: Secondary | ICD-10-CM

## 2020-08-27 DIAGNOSIS — H04123 Dry eye syndrome of bilateral lacrimal glands: Secondary | ICD-10-CM

## 2020-08-27 DIAGNOSIS — L821 Other seborrheic keratosis: Secondary | ICD-10-CM

## 2020-08-27 NOTE — Telephone Encounter (Signed)
My portion of form is completed, but [name] had not done the employee part prior to sending it to me.  Please find out where she wants it sent.  She had emailed it to me ([email or web address]).

## 2020-08-27 NOTE — Progress Notes (Signed)
Location:  Occupational psychologist of Service:  Clinic (12)  Provider: Caidynce Muzyka L. Mariea Clonts, D.O., C.M.D.  Code Status: DNR Goals of Care:  Advanced Directives 08/27/2020  Does Patient Have a Medical Advance Directive? Yes  Type of Advance Directive Out of facility DNR (pink MOST or yellow form)  Does patient want to make changes to medical advance directive? -  Copy of [place] in Chart? Yes - validated most recent copy scanned in chart (See row information)  Pre-existing out of facility DNR order (yellow form or pink MOST form) Pink MOST/Yellow Form most recent copy in chart - Physician notified to receive inpatient order   Chief Complaint  Patient presents with  . Medical Management of Chronic Issues    3 month follow up   . Health Maintenance    Influenza will be given at [facility], Tetanus/ Tdap , Dexa and PNA     HPI: Patient is a 86 y.o. female seen today for medical management of chronic diseases.    Mood is so so.  She has her ups and downs but is ok.  She's neatly dressed and groomed today wearing lipstick.    No pain.    Sleeps a lot.  Probably more than necessary, but it's her best activity.  Says the activities offered are boring to her and she is just hanging in here until she passes away.  Says her mother lived until 58 and she does not want to hang around that long.    Appetite she says is good.  Does not eat three meals--sometimes eats something in her fridge.    She wars a diaper as she puts it and her kidneys are still functioning.    C/o her keratoses being itchy.    Agrees to prevnar-13 pneumonia vaccine and routine annual labs.  We don't have the pneumonia vaccine here today so will need next visit.  Past Medical History:  Diagnosis Date  . Adult failure to thrive   . Bronchiectasis ([place])   . Cold hands   . Colon polyps   . Dementia, senile ([place])   . Depression   . Depression with anxiety   . Excessive ear wax   . Giant  cell tumor   . Hearing decreased, bilateral   . Hyperlipemia   . Impaired fasting glucose   . Memory changes   . Memory loss   . Osteoporosis   . Pelvic fracture ([place])   . Vitamin D deficiency     Past Surgical History:  Procedure Laterality Date  . bilateral fallopian tubes and ovararian resection (benign)  06/07/2006  . CATARACT EXTRACTION, BILATERAL    . JOINT REPLACEMENT  2006 -2007   HIP    Allergies  Allergen Reactions  . Antihistamines, Chlorpheniramine-Type     Outpatient Encounter Medications as of 08/27/2020  Medication Sig  . citalopram (CELEXA) 20 MG tablet Take 1 tablet (20 mg total) by mouth daily.  . [DISCONTINUED] atorvastatin (LIPITOR) 10 MG tablet Take 1 tablet by mouth daily.  . [DISCONTINUED] donepezil (ARICEPT) 5 MG tablet Take 1 tablet by mouth daily.  . [DISCONTINUED] Vitamin D, Ergocalciferol, (DRISDOL) 1.25 MG (50000 UNIT) CAPS capsule Take 50,000 Units by mouth every 7 (seven) days.   No facility-administered encounter medications on file as of 08/27/2020.    Review of Systems:  Review of Systems  Constitutional: Positive for malaise/fatigue. Negative for chills and fever.  HENT: Negative for congestion.   Eyes:  Glasses, right eye watery  Respiratory: Negative for cough and shortness of breath.   Cardiovascular: Positive for leg swelling. Negative for chest pain and palpitations.       Mild nonpitting edema  Gastrointestinal: Negative for abdominal pain, blood in stool, constipation, diarrhea and melena.  Genitourinary: Negative for dysuria.  Musculoskeletal: Negative for back pain, falls and joint pain.  Skin: Positive for itching (eczema, also seb ks) and rash.  Neurological: Negative for dizziness and loss of consciousness.  Psychiatric/Behavioral: Positive for depression and memory loss. The patient is not nervous/anxious and does not have insomnia.     Health Maintenance  Topic Date Due  . TETANUS/TDAP  Never done  . DEXA SCAN   Never done  . PNA vac Low Risk Adult (2 of 2 - PCV13) 09/19/2013  . INFLUENZA VACCINE  07/20/2020  . COVID-19 Vaccine  Completed    Physical Exam: Vitals:   08/27/20 0927  BP: 118/62  Pulse: 69  Temp: 97.7 F (36.5 C)  TempSrc: Temporal  SpO2: 92%  Weight: 161 lb (73 kg)  Height: 5\' 6"  (1.676 m)   Body mass index is 25.99 kg/m. Physical Exam Vitals reviewed.  Constitutional:      General: She is not in acute distress.    Appearance: Normal appearance. She is not toxic-appearing.  HENT:     Head: Normocephalic and atraumatic.  Eyes:     General:        Right eye: Discharge present.     Comments: glasses  Cardiovascular:     Rate and Rhythm: Normal rate and regular rhythm.     Pulses: Normal pulses.     Heart sounds: Normal heart sounds.  Pulmonary:     Effort: Pulmonary effort is normal.     Breath sounds: No wheezing, rhonchi or rales.  Abdominal:     General: Bowel sounds are normal.  Musculoskeletal:        General: Normal range of motion.     Right lower leg: Edema present.     Left lower leg: Edema present.     Comments: Nonpitting edema of both ankles and feet  Skin:    Comments: Multiple seborrheic keratoses of abdomen, back; dry scaly skin of forearms  Neurological:     General: No focal deficit present.     Mental Status: She is alert. Mental status is at baseline.     Comments: Walker use  Psychiatric:        Mood and Affect: Mood normal.     Comments: Chronic flat affect--reports boredom     Labs reviewed: Basic Metabolic Panel: No results for input(s): NA, K, CL, CO2, GLUCOSE, BUN, CREATININE, CALCIUM, MG, PHOS, TSH in the last 8760 hours. Liver Function Tests: No results for input(s): AST, ALT, ALKPHOS, BILITOT, PROT, ALBUMIN in the last 8760 hours. No results for input(s): LIPASE, AMYLASE in the last 8760 hours. No results for input(s): AMMONIA in the last 8760 hours. CBC: No results for input(s): WBC, NEUTROABS, HGB, HCT, MCV, PLT in the  last 8760 hours. Lipid Panel: No results for input(s): CHOL, HDL, LDLCALC, TRIG, CHOLHDL, LDLDIRECT in the last 8760 hours. Lab Results  Component Value Date   HGBA1C 5.7 08/08/2019    Procedures since last visit: No results found.  Assessment/Plan 1. Late onset Alzheimer's disease without behavioral disturbance ([place]) -gradually progressive -had been on aricept but opted to stop at last visit with her comfort goals  2. Depression, major, single episode, in partial remission ([place]) -  cont celexa -continues to be bored  3. Senile osteoporosis -not on vitamins any longer due to comfort-based goals  4. Seborrheic keratoses -ongoing, too numerous to count, none concerning  5. Intrinsic eczema -continue adequate moisturization after showers  6. Dry eyes -cont dry eye drops for this (right more bothersome than left--has not put her drops in this am)  7. Need for vaccination with 13-polyvalent pneumococcal conjugate vaccine -needs prevnar next visit since not available today  Labs/tests ordered:  Cbc, cmp, flp, tsh next draw in AL Next appt:  4 mos med mgt  Terrez Ander L. Danford Tat, D.O. [facility] [address], [place] [address] Cell Phone (Mon-Fri 8am-5pm):  [phone] On Call:  [phone] & follow prompts after 5pm & weekends Office Phone:  [phone] Office Fax:  [phone]

## 2020-08-28 ENCOUNTER — Telehealth: Payer: Self-pay

## 2020-08-28 ENCOUNTER — Encounter: Payer: Self-pay | Admitting: Internal Medicine

## 2020-08-28 LAB — BASIC METABOLIC PANEL
BUN: 19 (ref 4–21)
CO2: 24 — AB (ref 13–22)
Chloride: 103 (ref 99–108)
Creatinine: 0.7 (ref 0.5–1.1)
Glucose: 94
Potassium: 4.6 (ref 3.4–5.3)
Sodium: 137 (ref 137–147)

## 2020-08-28 LAB — COMPREHENSIVE METABOLIC PANEL
Albumin: 3.5 (ref 3.5–5.0)
Calcium: 9.2 (ref 8.7–10.7)
Globulin: 2.7

## 2020-08-28 LAB — HEPATIC FUNCTION PANEL
ALT: 5 — AB (ref 7–35)
AST: 13 (ref 13–35)

## 2020-08-28 LAB — CBC AND DIFFERENTIAL
HCT: 37 (ref 36–46)
Hemoglobin: 12.2 (ref 12.0–16.0)
Platelets: 332 (ref 150–399)
WBC: 6.5

## 2020-08-28 LAB — TSH: TSH: 2.33 (ref 0.41–5.90)

## 2020-08-28 LAB — CBC: RBC: 4.06 (ref 3.87–5.11)

## 2020-08-28 NOTE — Telephone Encounter (Signed)
I spoke to [name] she does not have a fax machine, however she is going to call back for [place] to fax it to [employer] or [employer] so she can fill out her part.

## 2020-08-29 NOTE — Telephone Encounter (Signed)
I am waiting to here back from her daughter on how to proceed.

## 2020-09-03 DIAGNOSIS — Z029 Encounter for administrative examinations, unspecified: Secondary | ICD-10-CM

## 2020-09-05 ENCOUNTER — Telehealth: Payer: Self-pay

## 2020-09-05 NOTE — Telephone Encounter (Signed)
The daughter called and stated she ([employer]) had not received a fax of FMLA paperwork. I printed forms from [employer] and faxed them to the provided fax # at [place] [phone]). I received confirmation that the fax had been received. I left a message on daughter's mobile # that I had faxed them and that had received confirmation. She had told me that she would wait at [place] for the fax.  I put the faxed forms in my bottom basket in the event that they need to be faxed again.

## 2020-09-12 ENCOUNTER — Encounter: Payer: Self-pay | Admitting: Internal Medicine

## 2020-09-12 ENCOUNTER — Telehealth: Payer: Self-pay

## 2020-09-12 NOTE — Telephone Encounter (Signed)
Per Dr. Mariea Clonts Electrolytes, liver, and Kidneys are all normal. Blood counts and thyroid are normal. I gave the results to [name] her daughter .

## 2020-09-24 ENCOUNTER — Other Ambulatory Visit: Payer: Self-pay

## 2020-09-24 ENCOUNTER — Non-Acute Institutional Stay: Payer: Medicare Other | Admitting: Internal Medicine

## 2020-09-24 ENCOUNTER — Encounter: Payer: Self-pay | Admitting: Internal Medicine

## 2020-09-24 VITALS — BP 118/62 | HR 74 | Temp 97.7°F | Ht 66.0 in | Wt 161.2 lb

## 2020-09-24 DIAGNOSIS — G301 Alzheimer's disease with late onset: Secondary | ICD-10-CM

## 2020-09-24 DIAGNOSIS — Z23 Encounter for immunization: Secondary | ICD-10-CM | POA: Diagnosis not present

## 2020-09-24 DIAGNOSIS — F324 Major depressive disorder, single episode, in partial remission: Secondary | ICD-10-CM

## 2020-09-24 DIAGNOSIS — F0280 Dementia in other diseases classified elsewhere without behavioral disturbance: Secondary | ICD-10-CM | POA: Diagnosis not present

## 2020-09-24 MED ORDER — DULOXETINE HCL 30 MG PO CPEP: 30.0000 mg | ORAL_CAPSULE | Freq: Every day | ORAL | 5 refills | Status: DC

## 2020-09-24 NOTE — Progress Notes (Signed)
Location:  [facility] clinic Provider:  Sarann Tregre L. Mariea Clonts, D.O., C.M.D.  Code Status: DNR Goals of Care:  Advanced Directives 09/24/2020  Does Patient Have a Medical Advance Directive? -  Type of Paramedic of [place];Out of facility DNR (pink MOST or yellow form);Living will  Does patient want to make changes to medical advance directive? -  Copy of [place] in Chart? Yes - validated most recent copy scanned in chart (See row information)  Pre-existing out of facility DNR order (yellow form or pink MOST form) Pink MOST/Yellow Form most recent copy in chart - Physician notified to receive inpatient order   Chief Complaint  Patient presents with  . Medical Management of Chronic Issues    6 month follow up   . Health Maintenance    Tetanus, PNR, and Influenza ([place])  . Acute Visit    feeling depressed     HPI: Patient is a 86 y.o. Lyons seen today for medical management of chronic diseases.    She is down and not motivated.  She is just ready to die.  Wants to give up and not try to do things.  She is having a particularly low day.  She takes an antidepressant medication--her celexa.  She is interested in changing it since it does not seem to be helpful.  She used to be on both effexor and celexa.   Her daughter is going to rent a house for the whole family at [facility] just feels like oh well about this which she reports is not her usual self.  She feels blah.  She sleeps a lot even during the day.  She eats ok--does two meals per day.  Skips one meal.  Skipped breakfast this am.  Had just coffee.  Usually meets people for lunch and supper.  Past Medical History:  Diagnosis Date  . Adult failure to thrive   . Bronchiectasis ([place])   . Cold hands   . Colon polyps   . Dementia, senile ([place])   . Depression   . Depression with anxiety   . Excessive ear wax   . Giant cell tumor   . Hearing decreased, bilateral   . Hyperlipemia   . Impaired  fasting glucose   . Memory changes   . Memory loss   . Osteoporosis   . Pelvic fracture ([place])   . Vitamin D deficiency     Past Surgical History:  Procedure Laterality Date  . bilateral fallopian tubes and ovararian resection (benign)  06/07/2006  . CATARACT EXTRACTION, BILATERAL    . JOINT REPLACEMENT  2006 -2007   HIP    Allergies  Allergen Reactions  . Antihistamines, Chlorpheniramine-Type     Outpatient Encounter Medications as of 09/24/2020  Medication Sig  . citalopram (CELEXA) 20 MG tablet Take 1 tablet (20 mg total) by mouth daily.   No facility-administered encounter medications on file as of 09/24/2020.    Review of Systems:  Review of Systems  Constitutional: Positive for malaise/fatigue. Negative for chills and fever.  HENT: Negative for congestion and sore throat.   Eyes: Negative for blurred vision.  Respiratory: Negative for cough and shortness of breath.   Cardiovascular: Negative for chest pain, palpitations and leg swelling.  Gastrointestinal: Negative for abdominal pain.  Genitourinary: Negative for dysuria.  Musculoskeletal: Negative for back pain, falls and joint pain.  Skin: Negative for itching and rash.  Neurological: Negative for dizziness and loss of consciousness.  Endo/Heme/Allergies: Bruises/bleeds easily.  Psychiatric/Behavioral: Positive for depression and memory loss. Negative for suicidal ideas. The patient is not nervous/anxious and does not have insomnia.        Wants to give up and die, but not going to harm herself    Health Maintenance  Topic Date Due  . TETANUS/TDAP  Never done  . PNA vac Low Risk Adult (2 of 2 - PCV13) 09/19/2013  . INFLUENZA VACCINE  07/20/2020  . DEXA SCAN  08/27/2021 (Originally 01/30/1993)  . COVID-19 Vaccine  Completed    Physical Exam: Vitals:   09/24/20 1053  BP: 118/62  Pulse: [place]  Temp: 97.7 F (36.5 C)  SpO2: 98%  Weight: 161 lb 3.2 oz (73.1 kg)  Height: 5\' 6"  (1.676 m)   Body mass index is  26.02 kg/m. Physical Exam Vitals reviewed.  Constitutional:      General: She is not in acute distress.    Appearance: Normal appearance. She is normal weight. She is not toxic-appearing.  HENT:     Head: Normocephalic and atraumatic.     Right Ear: External ear normal.     Left Ear: External ear normal.  Eyes:     Extraocular Movements: Extraocular movements intact.     Pupils: Pupils are equal, round, and reactive to light.  Cardiovascular:     Rate and Rhythm: Rhythm irregular.     Heart sounds: No murmur heard.   Pulmonary:     Effort: Pulmonary effort is normal.     Breath sounds: Normal breath sounds. No wheezing, rhonchi or rales.  Abdominal:     General: Bowel sounds are normal.  Musculoskeletal:        General: Normal range of motion.     Cervical back: Neck supple.     Right lower leg: No edema.     Left lower leg: No edema.  Lymphadenopathy:     Cervical: No cervical adenopathy.  Skin:    General: Skin is warm and dry.  Neurological:     General: No focal deficit present.     Mental Status: She is alert.     Cranial Nerves: No cranial nerve deficit.     Motor: No weakness.     Gait: Gait normal.     Comments: Uses rollator walker, oriented to self and [facility], not time but does remember she has her breakfast up in her room uneaten  Psychiatric:        Mood and Affect: Mood normal.        Behavior: Behavior normal.     Labs reviewed: Basic Metabolic Panel: Recent Labs    08/28/20 0000  NA 137  K 4.6  CL 103  CO2 24*  BUN 19  CREATININE 0.7  CALCIUM 9.2  TSH 2.33   Liver Function Tests: Recent Labs    08/28/20 0000  AST 13  ALT 5*  ALBUMIN 3.5   No results for input(s): LIPASE, AMYLASE in the last 8760 hours. No results for input(s): AMMONIA in the last 8760 hours. CBC: Recent Labs    08/28/20 0000  WBC 6.5  HGB 12.2  HCT 37  PLT 332   Lipid Panel: No results for input(s): CHOL, HDL, LDLCALC, TRIG, CHOLHDL, LDLDIRECT in the  last 8760 hours. Lab Results  Component Value Date   HGBA1C 5.7 08/08/2019   Assessment/Plan 1. Depression, major, single episode, in partial remission ([place]) - worse right now - stop celexa 20mg  which has been ineffective - start cymbalta -f/u 6 wks with me -  DULoxetine (CYMBALTA) 30 MG capsule; Take 1 capsule (30 mg total) by mouth daily.  Dispense: 30 capsule; Refill: 5  2. Late onset Alzheimer's disease without behavioral disturbance ([place]) -gradually progressive, still doing well in AL level of care   3. Need for vaccination with 13-polyvalent pneumococcal conjugate vaccine -we were not permitted to bring the vaccine to the domiciliary clinic any longer so, fortunately, pt is in AL so we'll have facility order it and give it to her instead  Labs/tests ordered:  No new Next appt: 6 wks f/u depression  Ladarian Bonczek L. Ramsey Guadamuz, D.O. [facility] [address],  [address] Cell Phone (Mon-Fri 8am-5pm):  [phone] On Call:  [phone] & follow prompts after 5pm & weekends Office Phone:  [phone] Office Fax:  [phone]

## 2020-11-05 ENCOUNTER — Encounter: Payer: Self-pay | Admitting: Internal Medicine

## 2020-11-05 ENCOUNTER — Other Ambulatory Visit: Payer: Self-pay

## 2020-11-05 ENCOUNTER — Non-Acute Institutional Stay: Payer: Medicare Other | Admitting: Internal Medicine

## 2020-11-05 VITALS — BP 118/64 | HR 84 | Temp 97.5°F | Ht 66.0 in | Wt 162.0 lb

## 2020-11-05 DIAGNOSIS — F324 Major depressive disorder, single episode, in partial remission: Secondary | ICD-10-CM

## 2020-11-05 DIAGNOSIS — F0280 Dementia in other diseases classified elsewhere without behavioral disturbance: Secondary | ICD-10-CM

## 2020-11-05 DIAGNOSIS — G301 Alzheimer's disease with late onset: Secondary | ICD-10-CM

## 2020-11-05 DIAGNOSIS — L2084 Intrinsic (allergic) eczema: Secondary | ICD-10-CM | POA: Diagnosis not present

## 2020-11-05 NOTE — Progress Notes (Signed)
Location:  Occupational psychologist of Service:  Clinic (12)  Provider: Luisa Louk L. Mariea Clonts, D.O., C.M.D.  Code Status: DNR Goals of Care:  Advanced Directives 11/05/2020  Does Patient Have a Medical Advance Directive? -  Type of Paramedic of [place];Out of facility DNR (pink MOST or yellow form);Living will  Does patient want to make changes to medical advance directive? -  Copy of [place] in Chart? Yes - validated most recent copy scanned in chart (See row information)  Pre-existing out of facility DNR order (yellow form or pink MOST form) Pink MOST/Yellow Form most recent copy in chart - Physician notified to receive inpatient order    Chief Complaint  Patient presents with  . Medical Management of Chronic Issues    6 week follow up     HPI: Patient is a 86 y.o. female seen today for medical management of chronic diseases.    Says she's not particularly up for anything--not motivated.  She'd rather take a nap and not go do much.  Not crying or tearful.  Just sleeping a lot.  Appetite is still fairly good.  Still follows her routine of skipping one meal.  Her daughter is renting a place here so the family can get together at Christmas, pt reports.   Family call her regularly--both grandsons.   Nothing really hurts.   Sleeping ok, just too much.  Naps in the day b/c she doesn't want to do anything else.   Had flu shot, covid booster and did get prevnar as ordered last month in AL.   , Past Medical History:  Diagnosis Date  . Adult failure to thrive   . Bronchiectasis ([place])   . Cold hands   . Colon polyps   . Dementia, senile ([place])   . Depression   . Depression with anxiety   . Excessive ear wax   . Giant cell tumor   . Hearing decreased, bilateral   . Hyperlipemia   . Impaired fasting glucose   . Memory changes   . Memory loss   . Osteoporosis   . Pelvic fracture ([place])   . Vitamin D deficiency     Past  Surgical History:  Procedure Laterality Date  . bilateral fallopian tubes and ovararian resection (benign)  06/07/2006  . CATARACT EXTRACTION, BILATERAL    . JOINT REPLACEMENT  2006 -2007   HIP    Allergies  Allergen Reactions  . Antihistamines, Chlorpheniramine-Type     Outpatient Encounter Medications as of 11/05/2020  Medication Sig  . DULoxetine (CYMBALTA) 30 MG capsule Take 1 capsule (30 mg total) by mouth daily.   No facility-administered encounter medications on file as of 11/05/2020.    Review of Systems:  Review of Systems  Constitutional: Positive for malaise/fatigue. Negative for chills and fever.  HENT: Negative for congestion and sore throat.   Eyes: Negative for blurred vision.  Respiratory: Negative for shortness of breath.   Cardiovascular: Negative for chest pain, palpitations and leg swelling.  Gastrointestinal: Negative for abdominal pain and constipation.  Genitourinary: Negative for dysuria.       Some chronic urge incontinence  Musculoskeletal: Negative for back pain, falls and joint pain.  Skin:       Dry thick skin on forearms  Neurological: Negative for dizziness and loss of consciousness.  Endo/Heme/Allergies: Bruises/bleeds easily.  Psychiatric/Behavioral: Positive for depression and memory loss. The patient is not nervous/anxious and does not have insomnia.  Health Maintenance  Topic Date Due  . TETANUS/TDAP  Never done  . PNA vac Low Risk Adult (2 of 2 - PCV13) 09/19/2013  . DEXA SCAN  08/27/2021 (Originally 01/30/1993)  . INFLUENZA VACCINE  Completed  . COVID-19 Vaccine  Completed    Physical Exam: Vitals:   11/05/20 0913  BP: 118/64  Pulse: 84  Temp: (!) 97.5 F (36.4 C)  TempSrc: Temporal  SpO2: 95%  Weight: 162 lb (73.5 kg)  Height: 5\' 6"  (1.676 m)   Body mass index is 26.15 kg/m. Physical Exam Vitals reviewed.  Constitutional:      General: She is not in acute distress.    Appearance: Normal appearance. She is not  toxic-appearing.  HENT:     Head: Normocephalic and atraumatic.  Cardiovascular:     Rate and Rhythm: Normal rate and regular rhythm.  Pulmonary:     Effort: Pulmonary effort is normal.     Breath sounds: Normal breath sounds. No wheezing, rhonchi or rales.  Abdominal:     General: Bowel sounds are normal.  Skin:    General: Skin is warm and dry.     Comments: Dry thick scaly skin on forearms; nails getting longer and scratching--reminded her about applying cream  Neurological:     General: No focal deficit present.     Mental Status: She is alert and oriented to person, place, and time.  Psychiatric:        Mood and Affect: Mood normal.     Comments: Still lacks motivation     Labs reviewed: Basic Metabolic Panel: Recent Labs    08/28/20 0000  NA 137  K 4.6  CL 103  CO2 24*  BUN 19  CREATININE 0.7  CALCIUM 9.2  TSH 2.33   Liver Function Tests: Recent Labs    08/28/20 0000  AST 13  ALT 5*  ALBUMIN 3.5   No results for input(s): LIPASE, AMYLASE in the last 8760 hours. No results for input(s): AMMONIA in the last 8760 hours. CBC: Recent Labs    08/28/20 0000  WBC 6.5  HGB 12.2  HCT 37  PLT 332   Lipid Panel: No results for input(s): CHOL, HDL, LDLCALC, TRIG, CHOLHDL, LDLDIRECT in the last 8760 hours. Lab Results  Component Value Date   HGBA1C 5.7 08/08/2019    Procedures since last visit: No results found.  Assessment/Plan 1. Depression, major, single episode, in partial remission ([place]) -spirits seem a bit better with cymbalta vs last time, still lacks motivation which is unchanged -cont same dose of cymbalta and monitor -does not seem miserable and sad like 6 wks ago  2. Late onset Alzheimer's disease without behavioral disturbance ([facility]) -moderate, managing well in AL at this point w/o any concerns reported by staff  3. Intrinsic eczema -keep nails trimmed and apply regular cream for moisture  Labs/tests ordered:  * No order type specified  * Next appt:  12/31/2020  Tamyka Bezio L. [place] Genova, D.O. [facility] [address], [place] [address] Cell Phone (Mon-Fri 8am-5pm):  [phone] On Call:  [phone] & follow prompts after 5pm & weekends Office Phone:  [phone] Office Fax:  [phone]

## 2020-12-31 ENCOUNTER — Non-Acute Institutional Stay: Payer: Medicare Other | Admitting: Internal Medicine

## 2020-12-31 ENCOUNTER — Encounter: Payer: Self-pay | Admitting: Internal Medicine

## 2020-12-31 ENCOUNTER — Other Ambulatory Visit: Payer: Self-pay

## 2020-12-31 VITALS — BP 126/82 | HR 83 | Temp 98.0°F | Ht 66.0 in | Wt 157.2 lb

## 2020-12-31 DIAGNOSIS — J479 Bronchiectasis, uncomplicated: Secondary | ICD-10-CM

## 2020-12-31 DIAGNOSIS — F0280 Dementia in other diseases classified elsewhere without behavioral disturbance: Secondary | ICD-10-CM

## 2020-12-31 DIAGNOSIS — L2084 Intrinsic (allergic) eczema: Secondary | ICD-10-CM | POA: Diagnosis not present

## 2020-12-31 DIAGNOSIS — G301 Alzheimer's disease with late onset: Secondary | ICD-10-CM | POA: Diagnosis not present

## 2020-12-31 DIAGNOSIS — F324 Major depressive disorder, single episode, in partial remission: Secondary | ICD-10-CM | POA: Diagnosis not present

## 2020-12-31 NOTE — Progress Notes (Signed)
Location:  Occupational psychologist of Service:  Clinic (12)  Provider: Jordanny Waddington L. Mariea Clonts, D.O., C.M.D.  Code Status: DNR Goals of Care:  Advanced Directives 11/05/2020  Does Patient Have a Medical Advance Directive? -  Type of Paramedic of [place];Out of facility DNR (pink MOST or yellow form);Living will  Does patient want to make changes to medical advance directive? -  Copy of [place] in Chart? Yes - validated most recent copy scanned in chart (See row information)  Pre-existing out of facility DNR order (yellow form or pink MOST form) Pink MOST/Yellow Form most recent copy in chart - Physician notified to receive inpatient order   Chief Complaint  Patient presents with  . Medical Management of Chronic Issues    Medical management of Chronic Issues. 4 month Follow up    HPI: Patient is a 87 y.o. female seen today for medical management of chronic diseases.    She is doing fine w/o complaints.  Says her usual lines about being 73 and living a good life.  She has no pain.  She has dry scaly skin on her right wrist.  She feels well.  She eats two meals a day and does fine with that.  She sleeps well.  She is tolerating 30mg  cymbalta.  Past Medical History:  Diagnosis Date  . Adult failure to thrive   . Bronchiectasis ([place])   . Cold hands   . Colon polyps   . Dementia, senile ([place])   . Depression   . Depression with anxiety   . Excessive ear wax   . Giant cell tumor   . Hearing decreased, bilateral   . Hyperlipemia   . Impaired fasting glucose   . Memory changes   . Memory loss   . Osteoporosis   . Pelvic fracture ([place])   . Vitamin D deficiency     Past Surgical History:  Procedure Laterality Date  . bilateral fallopian tubes and ovararian resection (benign)  06/07/2006  . CATARACT EXTRACTION, BILATERAL    . JOINT REPLACEMENT  2006 -2007   HIP    Allergies  Allergen Reactions  . Antihistamines,  Chlorpheniramine-Type     Outpatient Encounter Medications as of 12/31/2020  Medication Sig  . DULoxetine (CYMBALTA) 30 MG capsule Take 1 capsule (30 mg total) by mouth daily.   No facility-administered encounter medications on file as of 12/31/2020.    Review of Systems:  Review of Systems  Constitutional: Negative for chills, fever and malaise/fatigue.  HENT: Positive for hearing loss. Negative for congestion and sore throat.   Eyes: Negative for blurred vision.  Respiratory: Negative for cough and shortness of breath.   Cardiovascular: Positive for leg swelling. Negative for chest pain and palpitations.  Gastrointestinal: Negative for abdominal pain, blood in stool, constipation, diarrhea and melena.  Genitourinary: Negative for dysuria.  Musculoskeletal: Negative for falls and joint pain.  Skin: Positive for itching. Negative for rash.  Neurological: Negative for dizziness and loss of consciousness.  Psychiatric/Behavioral: Positive for depression and memory loss. The patient is not nervous/anxious and does not have insomnia.     Health Maintenance  Topic Date Due  . TETANUS/TDAP  Never done  . DEXA SCAN  08/27/2021 (Originally 01/30/1993)  . COVID-19 Vaccine (4 - Booster for Moderna series) 05/01/2021  . INFLUENZA VACCINE  Completed  . PNA vac Low Risk Adult  Completed    Physical Exam: Vitals:   12/31/20 1130  BP: 126/82  Pulse: 83  Temp: 98 F (36.7 C)  TempSrc: Skin  SpO2: 95%  Weight: 157 lb 3.2 oz (71.3 kg)  Height: 5\' 6"  (1.676 m)   Body mass index is 25.37 kg/m. Physical Exam Vitals reviewed.  Constitutional:      General: She is not in acute distress.    Appearance: Normal appearance. She is not toxic-appearing.  HENT:     Head: Normocephalic and atraumatic.  Eyes:     Comments: glasses  Cardiovascular:     Rate and Rhythm: Normal rate and regular rhythm.  Pulmonary:     Effort: Pulmonary effort is normal.     Breath sounds: Normal breath sounds.  No wheezing, rhonchi or rales.  Abdominal:     General: Bowel sounds are normal.  Musculoskeletal:        General: Normal range of motion.     Right lower leg: Edema present.     Left lower leg: Edema present.     Comments: Mild nonpitting edema  Skin:    General: Skin is warm and dry.     Comments: Dry scaly thickened skin right vs left wrist/forearm  Neurological:     Mental Status: She is alert. Mental status is at baseline.  Psychiatric:        Mood and Affect: Mood normal.     Labs reviewed: Basic Metabolic Panel: Recent Labs    08/28/20 0000  NA 137  K 4.6  CL 103  CO2 24*  BUN 19  CREATININE 0.7  CALCIUM 9.2  TSH 2.33   Liver Function Tests: Recent Labs    08/28/20 0000  AST 13  ALT 5*  ALBUMIN 3.5   No results for input(s): LIPASE, AMYLASE in the last 8760 hours. No results for input(s): AMMONIA in the last 8760 hours. CBC: Recent Labs    08/28/20 0000  WBC 6.5  HGB 12.2  HCT 37  PLT 332   Lipid Panel: No results for input(s): CHOL, HDL, LDLCALC, TRIG, CHOLHDL, LDLDIRECT in the last 8760 hours. Lab Results  Component Value Date   HGBA1C 5.7 08/08/2019    Procedures since last visit: No results found.  Assessment/Plan 1. Depression, major, single episode, in partial remission ([place]) -ongoing, cont cymbalta 30mg  daily which she's tolerating well -continues to say she's ready to die which is not new for her, but does not appear overtly sad or tearful and following her usual routine  -had nice visit with family over the holiday  2. Late onset Alzheimer's disease without behavioral disturbance ([place]) -ongoing, cont AL level of care  3. Intrinsic eczema -continue moisturizer on forearms  4. Bronchiectasis without complication ([place]) -no active symptoms and not on current therapy for this  Labs/tests ordered:  none Next appt:  6 mos f/u in [place]. Tarin Johndrow, D.O. [facility] [address], [place] [address] Cell Phone (Mon-Fri 8am-5pm):  [phone] On Call:  [phone] & follow prompts after 5pm & weekends Office Phone:  [phone] Office Fax:  [phone]

## 2021-01-01 ENCOUNTER — Encounter: Payer: Self-pay | Admitting: Internal Medicine

## 2021-02-09 ENCOUNTER — Encounter: Payer: Self-pay | Admitting: Internal Medicine

## 2021-02-25 ENCOUNTER — Telehealth: Payer: Self-pay | Admitting: Nurse Practitioner

## 2021-02-25 NOTE — Telephone Encounter (Signed)
Called to schedule AWV, left vm to call back to schedule. 

## 2021-07-01 ENCOUNTER — Encounter: Payer: Self-pay | Admitting: Internal Medicine

## 2021-07-15 ENCOUNTER — Encounter: Payer: Medicare Other | Admitting: Internal Medicine

## 2021-07-15 ENCOUNTER — Other Ambulatory Visit: Payer: Self-pay

## 2021-10-05 ENCOUNTER — Encounter: Payer: Self-pay | Admitting: Adult Health

## 2021-10-05 ENCOUNTER — Encounter: Payer: Self-pay | Admitting: Internal Medicine

## 2021-10-05 ENCOUNTER — Other Ambulatory Visit: Payer: Self-pay

## 2021-10-05 ENCOUNTER — Non-Acute Institutional Stay: Payer: Medicare Other | Admitting: Adult Health

## 2021-10-05 VITALS — BP 124/74 | HR 92 | Temp 97.1°F | Ht 66.0 in | Wt 156.8 lb

## 2021-10-05 DIAGNOSIS — R269 Unspecified abnormalities of gait and mobility: Secondary | ICD-10-CM | POA: Diagnosis not present

## 2021-10-05 DIAGNOSIS — G301 Alzheimer's disease with late onset: Secondary | ICD-10-CM | POA: Diagnosis not present

## 2021-10-05 DIAGNOSIS — M810 Age-related osteoporosis without current pathological fracture: Secondary | ICD-10-CM

## 2021-10-05 DIAGNOSIS — J479 Bronchiectasis, uncomplicated: Secondary | ICD-10-CM

## 2021-10-05 DIAGNOSIS — E559 Vitamin D deficiency, unspecified: Secondary | ICD-10-CM

## 2021-10-05 DIAGNOSIS — F0280 Dementia in other diseases classified elsewhere without behavioral disturbance: Secondary | ICD-10-CM

## 2021-10-05 DIAGNOSIS — F331 Major depressive disorder, recurrent, moderate: Secondary | ICD-10-CM

## 2021-10-05 NOTE — Progress Notes (Addendum)
Location:  [facility]  POS: clinic  Provider:  Cindi Carbon, [place] [phone]   Code Status: DNR Goals of Care:  Advanced Directives 10/05/2021  Does Patient Have a Medical Advance Directive? Yes  Type of Advance Directive Living will;Healthcare Power of Attorney  Does patient want to make changes to medical advance directive? No - Patient declined  Copy of [place] in Chart? Yes - validated most recent copy scanned in chart (See row information)  Pre-existing out of facility DNR order (yellow form or pink MOST form) -     Chief Complaint  Patient presents with   Acute Visit    Patient here today for depression evaluation.     HPI: Patient is a 87 y.o. female seen today for medical management of chronic diseases.  PMH significant for Alz dementia, bronchiectasis, depression, osteoporosis, HLD, hearing loss, impaired fasting glucose, vit d def.    The staff report that she appears depressed and lacks motivation. NO change in appetite or sleep. No tearful episodes reported. She is on cymbalta.  Weight is down 6 lbs in the past year  Reports she is chewing and swallowing ok.   No issues with cough, sob, congestion.   Hx of OP on the chart no recent dexa scan.    Wt Readings from Last 3 Encounters:  10/05/21 156 lb 12.8 oz (71.1 kg)  12/31/20 157 lb 3.2 oz (71.3 kg)  11/05/20 162 lb (73.5 kg)    Past Medical History:  Diagnosis Date   Adult failure to thrive    Bronchiectasis ([place])    Cold hands    Colon polyps    Dementia, senile ([place])    Depression    Depression with anxiety    Excessive ear wax    Giant cell tumor    per [facility] notes   Hearing decreased, bilateral    Hyperlipemia    Impaired fasting glucose    Memory changes    Memory loss    Osteoporosis    Pelvic fracture (HCC)    Vitamin D deficiency     Past Surgical History:  Procedure Laterality Date   bilateral fallopian tubes and ovararian resection  (benign)  06/07/2006   CATARACT EXTRACTION, BILATERAL     ORIF HIP FRACTURE  2006 -2007    Allergies  Allergen Reactions   Antihistamines, Chlorpheniramine-Type     Outpatient Encounter Medications as of 10/05/2021  Medication Sig   DULoxetine (CYMBALTA) 30 MG capsule Take 1 capsule (30 mg total) by mouth daily.   No facility-administered encounter medications on file as of 10/05/2021.    Review of Systems:  Review of Systems  Constitutional:  Negative for activity change, appetite change, chills, diaphoresis, fatigue, fever and unexpected weight change.  HENT:  Negative for congestion.   Respiratory:  Negative for cough, shortness of breath and wheezing.   Cardiovascular:  Positive for leg swelling. Negative for chest pain and palpitations.  Gastrointestinal:  Negative for abdominal distention, abdominal pain, constipation and diarrhea.  Genitourinary:  Negative for difficulty urinating and dysuria.  Musculoskeletal:  Positive for gait problem. Negative for arthralgias, back pain, joint swelling and myalgias.  Skin:  Negative for wound.  Neurological:  Negative for dizziness, tremors, seizures, syncope, facial asymmetry, speech difficulty, weakness, light-headedness, numbness and headaches.  Psychiatric/Behavioral:  Positive for confusion and dysphoric mood. Negative for agitation, behavioral problems, self-injury and sleep disturbance. The patient is not nervous/anxious.    Health Maintenance  Topic Date  Due   TETANUS/TDAP  Never done   Zoster Vaccines- Shingrix (1 of 2) Never done   DEXA SCAN  Never done   COVID-19 Vaccine (4 - Booster for Moderna series) 01/24/2021   INFLUENZA VACCINE  Completed   HPV VACCINES  Aged Out    Physical Exam: Vitals:   10/05/21 1332  BP: 124/74  Pulse: 92  Temp: (!) 97.1 F (36.2 C)  SpO2: 96%  Weight: 156 lb 12.8 oz (71.1 kg)  Height: 5\' 6"  (1.676 m)   Body mass index is 25.31 kg/m. Wt Readings from Last 3 Encounters:  10/05/21  156 lb 12.8 oz (71.1 kg)  12/31/20 157 lb 3.2 oz (71.3 kg)  11/05/20 162 lb (73.5 kg)    Physical Exam Vitals and nursing note reviewed.  Constitutional:      General: She is not in acute distress.    Appearance: She is not diaphoretic.  HENT:     Head: Normocephalic and atraumatic.     Right Ear: Ear canal normal.     Left Ear: Ear canal normal. There is no impacted cerumen.     Ears:     Comments: Cerumen in both ears    Nose: Nose normal. No congestion.     Mouth/Throat:     Mouth: Mucous membranes are moist.     Pharynx: Oropharynx is clear.  Eyes:     Conjunctiva/sclera: Conjunctivae normal.     Pupils: Pupils are equal, round, and reactive to light.  Neck:     Vascular: No JVD.  Cardiovascular:     Rate and Rhythm: Normal rate and regular rhythm.     Heart sounds: No murmur heard. Pulmonary:     Effort: Pulmonary effort is normal. No respiratory distress.     Breath sounds: Normal breath sounds. No wheezing.  Abdominal:     General: Bowel sounds are normal. There is no distension.     Palpations: Abdomen is soft.     Tenderness: There is no abdominal tenderness.  Musculoskeletal:     Right lower leg: Edema (+1) present.     Left lower leg: Edema (+1) present.  Skin:    General: Skin is warm and dry.     Coloration: Skin is not pale.     Comments: Very dry with lichenification to forearms  Neurological:     General: No focal deficit present.     Mental Status: She is alert and oriented to person, place, and time. Mental status is at baseline.     Comments: Forgetful of the details of her care  Psychiatric:        Mood and Affect: Mood normal.    Labs reviewed: Basic Metabolic Panel: No results for input(s): NA, K, CL, CO2, GLUCOSE, BUN, CREATININE, CALCIUM, MG, PHOS, TSH in the last 8760 hours. Liver Function Tests: No results for input(s): AST, ALT, ALKPHOS, BILITOT, PROT, ALBUMIN in the last 8760 hours. No results for input(s): LIPASE, AMYLASE in the last  8760 hours. No results for input(s): AMMONIA in the last 8760 hours. CBC: No results for input(s): WBC, NEUTROABS, HGB, HCT, MCV, PLT in the last 8760 hours. Lipid Panel: No results for input(s): CHOL, HDL, LDLCALC, TRIG, CHOLHDL, LDLDIRECT in the last 8760 hours. Lab Results  Component Value Date   HGBA1C 5.7 08/08/2019    Procedures since last visit: No results found.  Assessment/Plan  1. Age-related osteoporosis without current pathological fracture No recent BMD testing for review. Given her age and dementia would  not pursude Will add Ca with Vit D 600/400 bid   2. Moderate episode of recurrent major depressive disorder [facility]) May increase Cymbalta but need to check labs first If CRCL rising may consider zoloft instead   3. Gait abnormality Continue to use walker ot preven  4. Late onset Alzheimer's disease without behavioral disturbance (HCC) Mild to moderate stage MMSE 22/30 Remains and ambulatory, able to communicate.   5. Bronchiectasis without complication (HCC) No current symptom s  6. Vitamin D deficiency Added Ca with Vit d  Recommend dental cleanings q 6 months  Recommend TDAP injection   Labs/tests ordered:  * No order type specified *TSH CBC CMP Vit D Vit B12 in am  Next appt:  F/U with Dr Lyndel Safe in 3 months    Total time 43min:  time greater than 50% of total time spent doing pt counseling and coordination of care

## 2021-10-05 NOTE — Addendum Note (Signed)
Addended by: Barnie Mort on: 10/05/2021 05:01 PM   Modules accepted: Level of Service

## 2021-10-06 LAB — BASIC METABOLIC PANEL
BUN: 15 (ref 4–21)
CO2: 24 — AB (ref 13–22)
Chloride: 106 (ref 99–108)
Creatinine: 0.8 (ref 0.5–1.1)
Glucose: 87
Potassium: 4.4 (ref 3.4–5.3)
Sodium: 139 (ref 137–147)

## 2021-10-06 LAB — VITAMIN B12: Vitamin B-12: 345

## 2021-10-06 LAB — CBC: RBC: 4.21 (ref 3.87–5.11)

## 2021-10-06 LAB — COMPREHENSIVE METABOLIC PANEL
Albumin: 3.5 (ref 3.5–5.0)
Calcium: 9.1 (ref 8.7–10.7)
Globulin: 2.3

## 2021-10-06 LAB — HEPATIC FUNCTION PANEL
ALT: 5 — AB (ref 7–35)
AST: 14 (ref 13–35)
Alkaline Phosphatase: 90 (ref 25–125)
Bilirubin, Total: 0.4

## 2021-10-06 LAB — CBC AND DIFFERENTIAL
HCT: 39 (ref 36–46)
Hemoglobin: 12.6 (ref 12.0–16.0)
Platelets: 334 (ref 150–399)
WBC: 7.1

## 2021-10-06 LAB — VITAMIN D 25 HYDROXY (VIT D DEFICIENCY, FRACTURES): Vit D, 25-Hydroxy: 29.1

## 2021-10-06 LAB — TSH: TSH: 2.49 (ref 0.41–5.90)

## 2021-12-17 ENCOUNTER — Encounter: Payer: Self-pay | Admitting: Adult Health

## 2021-12-17 ENCOUNTER — Non-Acute Institutional Stay: Payer: Medicare Other | Admitting: Adult Health

## 2021-12-17 DIAGNOSIS — K5901 Slow transit constipation: Secondary | ICD-10-CM

## 2021-12-17 DIAGNOSIS — F321 Major depressive disorder, single episode, moderate: Secondary | ICD-10-CM

## 2021-12-17 MED ORDER — POLYETHYLENE GLYCOL 3350 17 GM/SCOOP PO POWD: 17.0000 g | ORAL | 1 refills | Status: AC

## 2021-12-17 NOTE — Progress Notes (Signed)
Location:  [facility] Room Number: 638-V Place of Service:  ALF [phone]) Provider:  Royal Hawthorn, NP   Patient Care Team: Virgie Dad, MD as PCP - General (Internal Medicine) Jodi Marble, MD as Consulting Physician (Otolaryngology) Calvert Cantor, MD as Consulting Physician (Ophthalmology)  Extended Emergency Contact Information Primary Emergency Contact: [facility] Address: [place], [place] [address] [address] [place] Phone: [phone] Mobile Phone: [phone] Relation: Daughter  Code Status:  DNR Goals of care: Advanced Directive information Advanced Directives 12/17/2021  Does Patient Have a Medical Advance Directive? Yes  Type of Paramedic of [place];Living will;Out of facility DNR (pink MOST or yellow form)  Does patient want to make changes to medical advance directive? No - Patient declined  Copy of [place] in Chart? Yes - validated most recent copy scanned in chart (See row information)  Pre-existing out of facility DNR order (yellow form or pink MOST form) Yellow form placed in chart (order not valid for inpatient use);Pink MOST form placed in chart (order not valid for inpatient use)     Chief Complaint  Patient presents with   Acute Visit    Depression and constipation     HPI:  Pt is a 87 y.o. female seen today for an acute visit for depression and constipation. [place] and has a hx of depression, anxiety, dementia, OP, and bronchiectasis. Her daughter visited her over the holidays and felt she was depressed despite starting Zoloft in Oct. She continues to express that she is ready to die. She sleeps much of the day. This has been going on for two years. She denies any pain. Appetite is reported as adequate although she has lost 5 lbs int he past year. Upon entering her apartment she was sitting in the recliner  staring at the wall. Her meal was pushed aside stating " Leave me alone I am ready to die".  She does not have a plan to kill herself but is more focused on end of life.  Wt Readings from Last 3 Encounters:  12/17/21 152 lb (68.9 kg)  10/05/21 156 lb 12.8 oz (71.1 kg)  12/31/20 157 lb 3.2 oz (71.3 kg)   In addition constipation is reported and miralax is requested.    Past Medical History:  Diagnosis Date   Adult failure to thrive    Bronchiectasis ([place])    Cold hands    Colon polyps    Dementia, senile ([place])    Depression    Depression with anxiety    Excessive ear wax    Giant cell tumor    per [facility] notes   Hearing decreased, bilateral    Hyperlipemia    Impaired fasting glucose    Memory changes    Memory loss    Osteoporosis    Pelvic fracture (HCC)    Vitamin D deficiency    Past Surgical History:  Procedure Laterality Date   bilateral fallopian tubes and ovararian resection (benign)  06/07/2006   CATARACT EXTRACTION, BILATERAL     ORIF HIP FRACTURE  2006 -2007    Allergies  Allergen Reactions   Antihistamines, Chlorpheniramine-Type     Outpatient Encounter Medications as of 12/17/2021  Medication Sig   Calcium Carb-Cholecalciferol (CALCIUM CARBONATE-VITAMIN D3) 600-400 MG-UNIT TABS Take 1 tablet by mouth in the morning and at bedtime.   sertraline (ZOLOFT) 25  MG tablet Take 25 mg by mouth daily.   [DISCONTINUED] DULoxetine (CYMBALTA) 30 MG capsule Take 1 capsule (30 mg total) by mouth daily.   No facility-administered encounter medications on file as of 12/17/2021.    Review of Systems  Constitutional:  Negative for activity change, appetite change, chills, diaphoresis, fatigue, fever and unexpected weight change.  HENT:  Negative for congestion.   Respiratory:  Negative for cough, shortness of breath and wheezing.   Cardiovascular:  Negative for chest pain, palpitations and leg swelling.  Gastrointestinal:  Positive for constipation. Negative for  abdominal distention, abdominal pain, diarrhea, nausea and vomiting.  Genitourinary:  Negative for difficulty urinating and dysuria.  Musculoskeletal:  Positive for gait problem. Negative for arthralgias, back pain, joint swelling and myalgias.  Neurological:  Negative for dizziness, tremors, seizures, syncope, facial asymmetry, speech difficulty, weakness, light-headedness, numbness and headaches.  Psychiatric/Behavioral:  Positive for confusion and dysphoric mood. Negative for agitation, behavioral problems, self-injury, sleep disturbance and suicidal ideas. The patient is nervous/anxious.    Immunization History  Administered Date(s) Administered   Fluad Quad(high Dose 65+) 10/01/2019   Influenza, High Dose Seasonal PF 10/10/2020   Influenza-Unspecified 10/03/2001, 10/07/2010, 10/06/2011, 09/19/2012, 09/28/2013, 10/11/2014, 10/19/2016, 09/30/2021   Moderna Covid-19 Vaccine Bivalent Booster 67yrs & up 10/20/2021   Moderna Sars-Covid-2 Vaccination 01/09/2020, 02/09/2020, 11/01/2020   Pneumococcal Conjugate-13 10/06/2020   Pneumococcal Polysaccharide-23 01/31/2003, 09/19/2012   Pertinent  Health Maintenance Due  Topic Date Due   DEXA SCAN  Never done   INFLUENZA VACCINE  Completed   Fall Risk 05/28/2020 08/27/2020 09/24/2020 12/31/2020 10/05/2021  Falls in the past year? 0 0 0 0 0  Was there an injury with Fall? 0 0 0 0 0  Fall Risk Category Calculator 0 0 0 0 0  Fall Risk Category Low Low Low Low Low  Patient Fall Risk Level Low fall risk Moderate fall risk Low fall risk - Low fall risk  Fall risk Follow up - - - - Falls evaluation completed   Functional Status Survey:    Vitals:   12/17/21 1002  BP: 138/81  Pulse: 71  Resp: 18  Temp: 97.8 F (36.6 C)  SpO2: 96%  Weight: 152 lb (68.9 kg)  Height: 5' 8.5" (1.74 m)   Body mass index is 22.78 kg/m. Physical Exam Vitals and nursing note reviewed.  Constitutional:      General: She is not in acute distress.    Appearance: She  is not diaphoretic.  HENT:     Head: Normocephalic and atraumatic.  Neck:     Vascular: No JVD.  Cardiovascular:     Rate and Rhythm: Normal rate and regular rhythm.     Heart sounds: No murmur heard. Pulmonary:     Effort: Pulmonary effort is normal. No respiratory distress.     Breath sounds: Normal breath sounds. No wheezing.  Abdominal:     General: Bowel sounds are normal. There is no distension.     Palpations: Abdomen is soft.     Tenderness: There is no abdominal tenderness.  Musculoskeletal:     Right lower leg: No edema.     Left lower leg: No edema.  Skin:    General: Skin is warm and dry.  Neurological:     General: No focal deficit present.     Mental Status: She is alert. Mental status is at baseline.  Psychiatric:     Comments: Depressed, easily irritated    Labs reviewed: Recent Labs    10/06/21  0000  NA 139  K 4.4  CL 106  CO2 24*  BUN 15  CREATININE 0.8  CALCIUM 9.1   Recent Labs    10/06/21 0000  AST 14  ALT 5*  ALKPHOS 90  ALBUMIN 3.5   Recent Labs    10/06/21 0000  WBC 7.1  HGB 12.6  HCT 39  PLT 334   Lab Results  Component Value Date   TSH 2.49 10/06/2021   Lab Results  Component Value Date   HGBA1C 5.7 08/08/2019   Lab Results  Component Value Date   CHOL 231 (A) 08/08/2019   HDL 59 08/08/2019   LDLCALC 150 08/08/2019   TRIG 112 08/08/2019    Significant Diagnostic Results in last 30 days:  No results found.  Assessment/Plan  1. Depression, major, single episode, moderate (HCC) Increase Zoloft to 50 mg F/U at apt next month   2. Slow transit constipation  - polyethylene glycol powder (GLYCOLAX/MIRALAX) 17 GM/SCOOP powder; Take 17 g by mouth every other day.  Dispense: 3350 g; Refill: 1   Family/ staff Communication: discussed with her daughter [name]  Labs/tests ordered:  NA

## 2022-01-06 ENCOUNTER — Non-Acute Institutional Stay: Payer: Medicare Other | Admitting: Internal Medicine

## 2022-01-06 ENCOUNTER — Other Ambulatory Visit: Payer: Self-pay

## 2022-01-06 ENCOUNTER — Encounter: Payer: Self-pay | Admitting: Internal Medicine

## 2022-01-06 VITALS — BP 134/88 | HR 92 | Temp 97.9°F | Ht 68.5 in | Wt 148.0 lb

## 2022-01-06 DIAGNOSIS — R269 Unspecified abnormalities of gait and mobility: Secondary | ICD-10-CM | POA: Diagnosis not present

## 2022-01-06 DIAGNOSIS — F0280 Dementia in other diseases classified elsewhere without behavioral disturbance: Secondary | ICD-10-CM

## 2022-01-06 DIAGNOSIS — F321 Major depressive disorder, single episode, moderate: Secondary | ICD-10-CM | POA: Diagnosis not present

## 2022-01-06 DIAGNOSIS — K5901 Slow transit constipation: Secondary | ICD-10-CM | POA: Diagnosis not present

## 2022-01-06 DIAGNOSIS — G301 Alzheimer's disease with late onset: Secondary | ICD-10-CM | POA: Diagnosis not present

## 2022-01-06 NOTE — Progress Notes (Signed)
Location:  [facility] Service:  Clinic (12)  Provider:   Code Status:  Goals of Care:  Advanced Directives 01/06/2022  Does Patient Have a Medical Advance Directive? Yes  Type of Paramedic of [place];Living will;Out of facility DNR (pink MOST or yellow form)  Does patient want to make changes to medical advance directive? No - Patient declined  Copy of [place] in Chart? Yes - validated most recent copy scanned in chart (See row information)  Pre-existing out of facility DNR order (yellow form or pink MOST form) Yellow form placed in chart (order not valid for inpatient use);Pink MOST form placed in chart (order not valid for inpatient use)     Chief Complaint  Patient presents with   Medical Management of Chronic Issues    Patient returns to the clinic for follow up. She has no concerns.     HPI: Patient is a 88 y.o. female seen today for medical management of chronic diseases.    Patient has h/o Dementia, Depression and Osteoporosis  She lives in [place] with her walker. NO falls No Nursing issues Wt Readings from Last 3 Encounters:  01/06/22 148 lb (67.1 kg)  12/17/21 152 lb (68.9 kg)  10/05/21 156 lb 12.8 oz (71.1 kg)    Her main issue is Depression She told me she is bored and wants to just Die Has been saying this for long time Daughter is aware too   Past Medical History:  Diagnosis Date   Adult failure to thrive    Bronchiectasis ([place])    Cold hands    Colon polyps    Dementia, senile ([place])    Depression    Depression with anxiety    Excessive ear wax    Giant cell tumor    per [facility] notes   Hearing decreased, bilateral    Hyperlipemia    Impaired fasting glucose    Memory changes    Memory loss    Osteoporosis    Pelvic fracture ([place])    Vitamin D deficiency     Past Surgical History:  Procedure Laterality Date   bilateral fallopian tubes and ovararian resection  (benign)  06/07/2006   CATARACT EXTRACTION, BILATERAL     ORIF HIP FRACTURE  2006 -2007    Allergies  Allergen Reactions   Antihistamines, Chlorpheniramine-Type     Outpatient Encounter Medications as of 01/06/2022  Medication Sig   Calcium Carb-Cholecalciferol (CALCIUM CARBONATE-VITAMIN D3) 600-400 MG-UNIT TABS Take 1 tablet by mouth in the morning and at bedtime.   polyethylene glycol powder (GLYCOLAX/MIRALAX) 17 GM/SCOOP powder Take 17 g by mouth every other day.   sertraline (ZOLOFT) 50 MG tablet Take 50 mg by mouth daily.   No facility-administered encounter medications on file as of 01/06/2022.    Review of Systems:  Review of Systems  Constitutional:  Negative for activity change and appetite change.  HENT: Negative.    Respiratory:  Negative for cough and shortness of breath.   Cardiovascular:  Negative for leg swelling.  Gastrointestinal:  Negative for constipation.  Genitourinary: Negative.   Musculoskeletal:  Positive for gait problem. Negative for arthralgias and myalgias.  Skin: Negative.   Neurological:  Negative for dizziness and weakness.  Psychiatric/Behavioral:  Positive for confusion and dysphoric mood. Negative for sleep disturbance.    Health Maintenance  Topic Date Due   TETANUS/TDAP  Never done   Zoster Vaccines- Shingrix (1 of 2) Never done  DEXA SCAN  Never done   Pneumonia Vaccine 82+ Years old  Completed   INFLUENZA VACCINE  Completed   COVID-19 Vaccine  Completed   HPV VACCINES  Aged Out    Physical Exam: Vitals:   01/06/22 1121  BP: 134/88  Pulse: 92  Temp: 97.9 F (36.6 C)  SpO2: 95%  Weight: 148 lb (67.1 kg)  Height: 5' 8.5" (1.74 m)   Body mass index is 22.18 kg/m. Physical Exam Vitals reviewed.  Constitutional:      Appearance: Normal appearance.  HENT:     Head: Normocephalic.     Nose: Nose normal.     Mouth/Throat:     Mouth: Mucous membranes are moist.     Pharynx: Oropharynx is clear.  Eyes:     Pupils: Pupils are  equal, round, and reactive to light.  Cardiovascular:     Rate and Rhythm: Normal rate and regular rhythm.     Pulses: Normal pulses.     Heart sounds: Normal heart sounds. No murmur heard. Pulmonary:     Effort: Pulmonary effort is normal.     Breath sounds: Normal breath sounds.  Abdominal:     General: Abdomen is flat. Bowel sounds are normal.     Palpations: Abdomen is soft.  Musculoskeletal:        General: No swelling.     Cervical back: Neck supple.  Skin:    General: Skin is warm.  Neurological:     General: No focal deficit present.     Mental Status: She is alert.  Psychiatric:        Mood and Affect: Mood normal.        Thought Content: Thought content normal.    Labs reviewed: Basic Metabolic Panel: Recent Labs    10/06/21 0000  NA 139  K 4.4  CL 106  CO2 24*  BUN 15  CREATININE 0.8  CALCIUM 9.1  TSH 2.49   Liver Function Tests: Recent Labs    10/06/21 0000  AST 14  ALT 5*  ALKPHOS 90  ALBUMIN 3.5   No results for input(s): LIPASE, AMYLASE in the last 8760 hours. No results for input(s): AMMONIA in the last 8760 hours. CBC: Recent Labs    10/06/21 0000  WBC 7.1  HGB 12.6  HCT 39  PLT 334   Lipid Panel: No results for input(s): CHOL, HDL, LDLCALC, TRIG, CHOLHDL, LDLDIRECT in the last 8760 hours. Lab Results  Component Value Date   HGBA1C 5.7 08/08/2019    Procedures since last visit: No results found.  Assessment/Plan 1. Depression, major, single episode, moderate ([place]) Was recently tapered off Cymbalta and Now on Zoloft Will not change anything today Continue to provide support Has one daughter in [place] one son many years ago  2. Slow transit constipation Doing well on Miralax  3. Late onset Alzheimer's disease without behavioral disturbance (HCC) In AL High Functioning  4. Gait abnormality Walks with walker All labs review No New orders  Labs/tests ordered:  * No order type specified * Next appt:  Visit date  not found

## 2022-02-08 ENCOUNTER — Encounter: Payer: Self-pay | Admitting: Internal Medicine

## 2022-02-26 ENCOUNTER — Telehealth: Payer: Self-pay | Admitting: Adult Health

## 2022-02-26 DIAGNOSIS — F331 Major depressive disorder, recurrent, moderate: Secondary | ICD-10-CM

## 2022-02-26 DIAGNOSIS — F419 Anxiety disorder, unspecified: Secondary | ICD-10-CM

## 2022-02-26 MED ORDER — LORAZEPAM 0.5 MG PO TABS: 0.2500 mg | ORAL_TABLET | Freq: Two times a day (BID) | ORAL | 0 refills | Status: AC | PRN

## 2022-02-26 NOTE — Telephone Encounter (Signed)
Nurse called to report that [name] is having some anxiety. She has been expressing wishes to die for quite some time.  Zoloft on 50 mg. WIll increase zoloft to 75 mg and add prn ativan for 14 days. She will see me in the clinic on Monday. ?

## 2022-03-01 ENCOUNTER — Encounter: Payer: Self-pay | Admitting: Adult Health

## 2022-03-01 ENCOUNTER — Non-Acute Institutional Stay: Payer: Medicare Other | Admitting: Adult Health

## 2022-03-01 ENCOUNTER — Other Ambulatory Visit: Payer: Self-pay

## 2022-03-01 VITALS — BP 122/76 | HR 90 | Temp 98.1°F | Ht 68.5 in | Wt 142.2 lb

## 2022-03-01 DIAGNOSIS — L2084 Intrinsic (allergic) eczema: Secondary | ICD-10-CM

## 2022-03-01 DIAGNOSIS — R634 Abnormal weight loss: Secondary | ICD-10-CM | POA: Diagnosis not present

## 2022-03-01 DIAGNOSIS — F331 Major depressive disorder, recurrent, moderate: Secondary | ICD-10-CM

## 2022-03-01 MED ORDER — TRIAMCINOLONE ACETONIDE 0.1 % EX CREA: 1.0000 "application " | TOPICAL_CREAM | Freq: Two times a day (BID) | CUTANEOUS | 0 refills | Status: AC

## 2022-03-01 NOTE — Progress Notes (Signed)
? ?[facility] ? ?POS:  clinic ? ?Provider:  ?Cindi Carbon, ANP ?[place] ?[phone] ? ? ?Code Status: DNR and most form ?Goals of Care:  ?Advanced Directives 01/06/2022  ?Does Patient Have a Medical Advance Directive? Yes  ?Type of Paramedic of [place];Living will;Out of facility DNR (pink MOST or yellow form)  ?Does patient want to make changes to medical advance directive? No - Patient declined  ?Copy of [place] in Chart? Yes - validated most recent copy scanned in chart (See row information)  ?Pre-existing out of facility DNR order (yellow form or pink MOST form) Yellow form placed in chart (order not valid for inpatient use);Pink MOST form placed in chart (order not valid for inpatient use)  ? ? ? ?Chief Complaint  ?Patient presents with  ? Acute Visit  ?  Depression  ? ? ?HPI: Patient is a 88 y.o. female seen today for an acute visit for depression and anxiety ?Reports that she sleeps well. She continues to feel depressed. Not sure if the zoloft is working. Denies any feelings of anxiety or palpitations. Reports a good appetite but has lost 10 lbs in the past few months.  ?Says she is ready to give up (she has been saying this for years). Her daughter has let us know that her goals of care for her mom are comfort based. She does not want any life prolonging measures. Her nurse reports she is having more incontinence and sleeping more.  ?Depression screen [facility] 2/9 03/01/2022 09/24/2020 08/27/2020 01/02/2020 12/05/2019  ?Decreased Interest 3 1 0 0 0  ?Down, Depressed, Hopeless 3 1 0 0 0  ?PHQ - 2 Score 6 2 0 0 0  ?Altered sleeping 3 - - - -  ?Tired, decreased energy 3 - - - -  ?Change in appetite 2 - - - -  ?Feeling bad or failure about yourself  2 - - - -  ?Trouble concentrating 2 - - - -  ?Moving slowly or fidgety/restless 1 - - - -  ?Suicidal thoughts 3 - - - -  ?PHQ-9 Score 22 - - - -  ?Difficult doing work/chores Somewhat difficult - - - -  ?  ?Past  Medical History:  ?Diagnosis Date  ? Adult failure to thrive   ? Bronchiectasis ([place])   ? Cold hands   ? Colon polyps   ? Dementia, senile ([place])   ? Depression   ? Depression with anxiety   ? Excessive ear wax   ? Giant cell tumor   ? per [facility] notes  ? Hearing decreased, bilateral   ? Hyperlipemia   ? Impaired fasting glucose   ? Memory changes   ? Memory loss   ? Osteoporosis   ? Pelvic fracture (HCC)   ? Vitamin D deficiency   ? ? ?Past Surgical History:  ?Procedure Laterality Date  ? bilateral fallopian tubes and ovararian resection (benign)  06/07/2006  ? CATARACT EXTRACTION, BILATERAL    ? ORIF HIP FRACTURE  2006 -2007  ? ? ?Allergies  ?Allergen Reactions  ? Antihistamines, Chlorpheniramine-Type   ? ? ?Outpatient Encounter Medications as of 03/01/2022  ?Medication Sig  ? Calcium Carb-Cholecalciferol (CALCIUM CARBONATE-VITAMIN D3) 600-400 MG-UNIT TABS Take 1 tablet by mouth in the morning and at bedtime.  ? LORazepam (ATIVAN) 0.5 MG tablet Take 0.5 tablets (0.25 mg total) by mouth 2 (two) times daily as needed for up to 14 days for anxiety.  ? polyethylene glycol powder (GLYCOLAX/MIRALAX) 17  GM/SCOOP powder Take 17 g by mouth every other day.  ? sertraline (ZOLOFT) 50 MG tablet Take 75 mg by mouth daily.  ? ?No facility-administered encounter medications on file as of 03/01/2022.  ? ? ?Review of Systems:  ?Review of Systems  ?Constitutional:  Positive for fatigue and unexpected weight change. Negative for activity change, appetite change, chills, diaphoresis and fever.  ?HENT:  Negative for congestion.   ?Respiratory:  Negative for cough, shortness of breath and wheezing.   ?Cardiovascular:  Negative for chest pain, palpitations and leg swelling.  ?Gastrointestinal:  Negative for abdominal distention, abdominal pain, constipation and diarrhea.  ?Genitourinary:  Negative for difficulty urinating and dysuria.  ?Musculoskeletal:  Positive for gait problem. Negative for arthralgias, back pain, joint swelling and  myalgias.  ?Skin:  Positive for rash.  ?Neurological:  Negative for dizziness, tremors, seizures, syncope, facial asymmetry, speech difficulty, weakness, light-headedness, numbness and headaches.  ?Psychiatric/Behavioral:  Positive for confusion and dysphoric mood. Negative for agitation, behavioral problems, self-injury, sleep disturbance and suicidal ideas. The patient is nervous/anxious.   ? ?Health Maintenance  ?Topic Date Due  ? TETANUS/TDAP  Never done  ? Zoster Vaccines- Shingrix (1 of 2) Never done  ? DEXA SCAN  Never done  ? Pneumonia Vaccine 59+ Years old  Completed  ? INFLUENZA VACCINE  Completed  ? COVID-19 Vaccine  Completed  ? HPV VACCINES  Aged Out  ? ? ?Physical Exam: ?Vitals:  ? 03/01/22 1545  ?BP: 122/76  ?Pulse: 90  ?Temp: 98.1 ?F (36.7 ?C)  ?SpO2: 96%  ?Weight: 142 lb 3.2 oz (64.5 kg)  ?Height: 5' 8.5" (1.74 m)  ? ?Body mass index is 21.31 kg/m?. ?Wt Readings from Last 3 Encounters:  ?03/01/22 142 lb 3.2 oz (64.5 kg)  ?01/06/22 148 lb (67.1 kg)  ?12/17/21 152 lb (68.9 kg)  ? ? ?Physical Exam ?Vitals and nursing note reviewed.  ?Constitutional:   ?   General: She is not in acute distress. ?   Appearance: She is not diaphoretic.  ?HENT:  ?   Head: Normocephalic and atraumatic.  ?Neck:  ?   Vascular: No JVD.  ?Cardiovascular:  ?   Rate and Rhythm: Normal rate and regular rhythm.  ?   Heart sounds: No murmur heard. ?Pulmonary:  ?   Effort: Pulmonary effort is normal. No respiratory distress.  ?   Breath sounds: Normal breath sounds. No wheezing.  ?Abdominal:  ?   General: Bowel sounds are normal. There is no distension.  ?   Palpations: Abdomen is soft.  ?   Tenderness: There is no abdominal tenderness.  ?Musculoskeletal:  ?   Right lower leg: No edema.  ?   Left lower leg: Edema (trace) present.  ?Skin: ?   General: Skin is warm and dry.  ?Neurological:  ?   General: No focal deficit present.  ?   Mental Status: She is alert. Mental status is at baseline.  ?Psychiatric:  ?   Comments: Smiles but  says she is ready to die. Not tearful today.   ? ? ?Labs reviewed: ?Basic Metabolic Panel: ?Recent Labs  ?  10/06/21 ?0000  ?NA 139  ?K 4.4  ?CL 106  ?CO2 24*  ?BUN 15  ?CREATININE 0.8  ?CALCIUM 9.1  ?TSH 2.49  ? ?Liver Function Tests: ?Recent Labs  ?  10/06/21 ?0000  ?AST 14  ?ALT 5*  ?ALKPHOS 90  ?ALBUMIN 3.5  ? ?No results for input(s): LIPASE, AMYLASE in the last 8760 hours. ?No results for  input(s): AMMONIA in the last 8760 hours. ?CBC: ?Recent Labs  ?  10/06/21 ?0000  ?WBC 7.1  ?HGB 12.6  ?HCT 39  ?PLT 334  ? ?Lipid Panel: ?No results for input(s): CHOL, HDL, LDLCALC, TRIG, CHOLHDL, LDLDIRECT in the last 8760 hours. ?Lab Results  ?Component Value Date  ? HGBA1C 5.7 08/08/2019  ? ? ?Procedures since last visit: ?No results found. ? ?Assessment/Plan ? ?1. Moderate episode of recurrent major depressive disorder ([place]) ?She did smile more in our visit but continues to have s/s of depression ?Since zoloft was just increased on 3/10 will continue the same dose. Follow closely in one month and possibly add low dose zyprexa per discussion with Dr. Lyndel Safe.  ?Has tried cymbalta and celexa in the past.  ? ?2. Weight loss ?Due to progressive weight loss and dementia along with depression. Goals of care are comfort based.  ? ?3. Intrinsic eczema ?Apply to chest, arms, other affected areas.  ?- triamcinolone cream (KENALOG) 0.1 %; Apply 1 application. topically 2 (two) times daily for 10 days.  Dispense: 30 g; Refill: 0 ? ? ?Labs/tests ordered:  * No order type specified * ?Next appt:  one month  ? ?Total time 26mn:  time greater than 50% of total time spent doing pt counseling and coordination of care  ? ?Left message with her daughter AWebb Silversmith  ?

## 2022-03-08 ENCOUNTER — Encounter: Payer: Self-pay | Admitting: Adult Health

## 2022-03-08 ENCOUNTER — Non-Acute Institutional Stay: Payer: Medicare Other | Admitting: Adult Health

## 2022-03-08 DIAGNOSIS — M545 Low back pain, unspecified: Secondary | ICD-10-CM

## 2022-03-08 DIAGNOSIS — R5383 Other fatigue: Secondary | ICD-10-CM

## 2022-03-08 DIAGNOSIS — M810 Age-related osteoporosis without current pathological fracture: Secondary | ICD-10-CM | POA: Diagnosis not present

## 2022-03-08 NOTE — Progress Notes (Signed)
? ?Location: [facility]  ? ?POS:  AL ? ?Provider:  ?Cindi Carbon, ANP ?[place] ?[phone] ? ?Code Status: DNR ?Goals of Care:  ?Advanced Directives 03/01/2022  ?Does Patient Have a Medical Advance Directive? Yes  ?Type of Paramedic of [place];Living will;Out of facility DNR (pink MOST or yellow form)  ?Does patient want to make changes to medical advance directive? No - Patient declined  ?Copy of [place] in Chart? Yes - validated most recent copy scanned in chart (See row information)  ?Pre-existing out of facility DNR order (yellow form or pink MOST form) Yellow form placed in chart (order not valid for inpatient use);Pink MOST form placed in chart (order not valid for inpatient use)  ? ? ? ?Chief Complaint  ?Patient presents with  ? Acute Visit  ?  fall  ? ? ?HPI: Patient is a 88 y.o. female seen today for back pain s/p fall. Nurse reports on 3/18 the resident had a mechanical fall (no head injury on syncope).  Now she is having mid to low back pain. Lumbar xray was ordered showing no acute fracture. Does have old L1 compression fracture.  ?Pt reports mild pain but is sore after the fall leading to more difficulty rising from the chair.  ?She is on zoloft and the dose was increase to 75 mg on 3/10 due to feelings of depression and lack of motivation. She has been losing weight and wanting to "give up".  Ms. Broberg feels drowsy and the nurse reports lately she has been sleeping more and reading less.  ?Past Medical History:  ?Diagnosis Date  ? Adult failure to thrive   ? Bronchiectasis ([place])   ? Cold hands   ? Colon polyps   ? Dementia, senile ([place])   ? Depression   ? Depression with anxiety   ? Excessive ear wax   ? Giant cell tumor   ? per [facility] notes  ? Hearing decreased, bilateral   ? Hyperlipemia   ? Impaired fasting glucose   ? Memory changes   ? Memory loss   ? Osteoporosis   ? Pelvic fracture (HCC)   ? Vitamin D deficiency   ? ? ?Past  Surgical History:  ?Procedure Laterality Date  ? bilateral fallopian tubes and ovararian resection (benign)  06/07/2006  ? CATARACT EXTRACTION, BILATERAL    ? ORIF HIP FRACTURE  2006 -2007  ? ? ?Allergies  ?Allergen Reactions  ? Antihistamines, Chlorpheniramine-Type   ? ? ?Outpatient Encounter Medications as of 03/08/2022  ?Medication Sig  ? Calcium Carb-Cholecalciferol (CALCIUM CARBONATE-VITAMIN D3) 600-400 MG-UNIT TABS Take 1 tablet by mouth in the morning and at bedtime.  ? LORazepam (ATIVAN) 0.5 MG tablet Take 0.5 tablets (0.25 mg total) by mouth 2 (two) times daily as needed for up to 14 days for anxiety.  ? polyethylene glycol powder (GLYCOLAX/MIRALAX) 17 GM/SCOOP powder Take 17 g by mouth every other day.  ? sertraline (ZOLOFT) 50 MG tablet Take 75 mg by mouth daily.  ? triamcinolone cream (KENALOG) 0.1 % Apply 1 application. topically 2 (two) times daily for 10 days.  ? ?No facility-administered encounter medications on file as of 03/08/2022.  ? ? ?Review of Systems:  ?Review of Systems  ?Constitutional:  Positive for activity change, appetite change, fatigue and unexpected weight change. Negative for chills, diaphoresis and fever.  ?HENT:  Negative for congestion.   ?Respiratory:  Negative for cough, shortness of breath and wheezing.   ?Cardiovascular:  Negative for  chest pain, palpitations and leg swelling.  ?Gastrointestinal:  Negative for abdominal distention, abdominal pain, constipation and diarrhea.  ?Genitourinary:  Negative for difficulty urinating and dysuria.  ?Musculoskeletal:  Positive for back pain and gait problem. Negative for arthralgias, joint swelling and myalgias.  ?Neurological:  Negative for dizziness, tremors, seizures, syncope, facial asymmetry, speech difficulty, weakness, light-headedness, numbness and headaches.  ?Psychiatric/Behavioral:  Positive for confusion and dysphoric mood. Negative for agitation, behavioral problems, sleep disturbance and suicidal ideas.   ? ?Health  Maintenance  ?Topic Date Due  ? TETANUS/TDAP  Never done  ? Zoster Vaccines- Shingrix (1 of 2) Never done  ? DEXA SCAN  Never done  ? Pneumonia Vaccine 51+ Years old  Completed  ? INFLUENZA VACCINE  Completed  ? COVID-19 Vaccine  Completed  ? HPV VACCINES  Aged Out  ? ? ?Physical Exam: ?There were no vitals filed for this visit. ?There is no height or weight on file to calculate BMI. ?Physical Exam ?Vitals and nursing note reviewed.  ?Constitutional:   ?   General: She is not in acute distress. ?   Appearance: She is not diaphoretic.  ?HENT:  ?   Head: Normocephalic and atraumatic.  ?Neck:  ?   Vascular: No JVD.  ?Cardiovascular:  ?   Rate and Rhythm: Normal rate and regular rhythm.  ?   Heart sounds: No murmur heard. ?Pulmonary:  ?   Effort: Pulmonary effort is normal. No respiratory distress.  ?   Breath sounds: Normal breath sounds. No wheezing.  ?Abdominal:  ?   General: Bowel sounds are normal. There is no distension.  ?   Palpations: Abdomen is soft.  ?   Tenderness: There is no abdominal tenderness.  ?Musculoskeletal:     ?   General: No swelling, tenderness, deformity or signs of injury.  ?   Comments: BLE edema trace. Venous insuf noted to BLE ?Rotated BUE and BLE including both hips and knees. No pain with noted with rotation or deformity. No spine tenderness  ?Skin: ?   General: Skin is warm and dry.  ?Neurological:  ?   General: No focal deficit present.  ?   Mental Status: She is alert. Mental status is at baseline.  ? ? ?Labs reviewed: ?Basic Metabolic Panel: ?Recent Labs  ?  10/06/21 ?0000  ?NA 139  ?K 4.4  ?CL 106  ?CO2 24*  ?BUN 15  ?CREATININE 0.8  ?CALCIUM 9.1  ?TSH 2.49  ? ?Liver Function Tests: ?Recent Labs  ?  10/06/21 ?0000  ?AST 14  ?ALT 5*  ?ALKPHOS 90  ?ALBUMIN 3.5  ? ?No results for input(s): LIPASE, AMYLASE in the last 8760 hours. ?No results for input(s): AMMONIA in the last 8760 hours. ?CBC: ?Recent Labs  ?  10/06/21 ?0000  ?WBC 7.1  ?HGB 12.6  ?HCT 39  ?PLT 334  ? ?Lipid Panel: ?No  results for input(s): CHOL, HDL, LDLCALC, TRIG, CHOLHDL, LDLDIRECT in the last 8760 hours. ?Lab Results  ?Component Value Date  ? HGBA1C 5.7 08/08/2019  ? ? ?Procedures since last visit: ?No results found. ? ?Assessment/Plan ? ?1. Acute midline low back pain without sciatica ?No acute fracture. Old L1 compression fracture due to osteoporosis most likely. Will schedule tylenol bid for 1 week. Nurse to monitor pt and report if pain is worsening or not improving.  ? ?2. Lethargy ?Very alert today but has felt drowsy recently  ?Possible med s/e?   ?Will reduce zoloft back to 50 mg ?Check BMP as zoloft  can cause low sodium.  ? ?3. Osteoporosis ?Has old L1 compression fracture. Due to her age, dementia, and goals of care would not pursue additional treatment for osteoporosis. She is already on Ca with Vit D (also for depression)  ? ? ?Labs/tests ordered:  * No order type specified * BMP, back xray ?Next appt:  Visit date not found ? ? ?Total time 57mn:  time greater than 50% of total time spent doing pt counseling and coordination of care  ? ? ? ?  ?

## 2022-03-09 LAB — BASIC METABOLIC PANEL
BUN: 15 (ref 4–21)
CO2: 23 — AB (ref 13–22)
Chloride: 96 — AB (ref 99–108)
Creatinine: 0.6 (ref 0.5–1.1)
Glucose: 106
Potassium: 4.3 mEq/L (ref 3.5–5.1)
Sodium: 131 — AB (ref 137–147)

## 2022-03-09 LAB — COMPREHENSIVE METABOLIC PANEL: Calcium: 9.8 (ref 8.7–10.7)

## 2022-03-11 ENCOUNTER — Non-Acute Institutional Stay: Payer: Medicare Other | Admitting: Adult Health

## 2022-03-11 ENCOUNTER — Encounter: Payer: Self-pay | Admitting: Adult Health

## 2022-03-11 DIAGNOSIS — G301 Alzheimer's disease with late onset: Secondary | ICD-10-CM

## 2022-03-11 DIAGNOSIS — R531 Weakness: Secondary | ICD-10-CM | POA: Diagnosis not present

## 2022-03-11 DIAGNOSIS — R627 Adult failure to thrive: Secondary | ICD-10-CM

## 2022-03-11 DIAGNOSIS — W19XXXD Unspecified fall, subsequent encounter: Secondary | ICD-10-CM

## 2022-03-11 DIAGNOSIS — F0280 Dementia in other diseases classified elsewhere without behavioral disturbance: Secondary | ICD-10-CM

## 2022-03-11 DIAGNOSIS — M545 Low back pain, unspecified: Secondary | ICD-10-CM | POA: Diagnosis not present

## 2022-03-11 DIAGNOSIS — R634 Abnormal weight loss: Secondary | ICD-10-CM

## 2022-03-11 MED ORDER — LIDOCAINE 5 % EX PTCH: 1.0000 | MEDICATED_PATCH | CUTANEOUS | 0 refills | Status: AC

## 2022-03-11 NOTE — Progress Notes (Signed)
?Location:  [facility] ?Nursing Home Room Number: [id number] ?Place of Service:  ALF (13) ?Provider:  Royal Hawthorn, NP  ? ?Patient Care Team: ?Virgie Dad, MD as PCP - General (Internal Medicine) ?Jodi Marble, MD as Consulting Physician (Otolaryngology) ?Calvert Cantor, MD as Consulting Physician (Ophthalmology) ? ?Extended Emergency Contact Information ?Primary Emergency Contact: Aurora ?Address: [address]         [place], [place] [address] [place] ?Home Phone: [phone] ?Mobile Phone: [phone] ?Relation: Daughter ? ?Code Status:  DNR ?Goals of care: Advanced Directive information ? ?  03/11/2022  ? 10:31 AM  ?Advanced Directives  ?Does Patient Have a Medical Advance Directive? Yes  ?Type of [employer] of [place];Living will;Out of facility DNR (pink MOST or yellow form)  ?Does patient want to make changes to medical advance directive? No - Patient declined  ?Copy of [place] in Chart? Yes - validated most recent copy scanned in chart (See row information)  ?Pre-existing out of facility DNR order (yellow form or pink MOST form) Yellow form placed in chart (order not valid for inpatient use);Pink MOST form placed in chart (order not valid for inpatient use)  ? ? ? ?Chief Complaint  ?Patient presents with  ? Acute Visit  ?  Functional decline   ? ? ?HPI:  ?Pt is a 88 y.o. female seen today for an acute visit for weakness and change in status with functional decline.  ?She had a fall on 3/18 which led to mid to low back pain. She has been using tylenol with some relief. Since the fall she has been weaker and needing more assistance. (no head injury on syncope). Lumbar xray showing no acute fracture. Does have old L1 compression fracture.  ?She has dementia and depression. Currently on 50 of zoloft She felt 75 mg made her drowsy. She is not eating well and has no appetite. She needs help getting up and  walking to the BR. Needs cuing and encouraging to take baths, eat, go to BR etc.  ?Has BMP done due to monitoring while on zoloft, NA 131.  ?On 3/21 she was lethargic and difficult to arouse. Vitals were normal. Her daughter was notified and declined further ER eval due to goals of care as previous documented. She has a most form confirming no hospitalizations, no IV, no tube feeding, no antibiotics.  ?As of today she is alert, has no acute complaints other than her mid to low back is sore from the fall. Walking with walker and one person assist.  ? ?Past Medical History:  ?Diagnosis Date  ? Adult failure to thrive   ? Bronchiectasis ([facility])   ? Cold hands   ? Colon polyps   ? Dementia, senile ([facility])   ? Depression   ? Depression with anxiety   ? Excessive ear wax   ? Giant cell tumor   ? per [facility] notes  ? Hearing decreased, bilateral   ? Hyperlipemia   ? Impaired fasting glucose   ? Memory changes   ? Memory loss   ? Osteoporosis   ? Pelvic fracture ([facility])   ? Vitamin D deficiency   ? ?Past Surgical History:  ?Procedure Laterality Date  ? bilateral fallopian tubes and ovararian resection (benign)  06/07/2006  ? CATARACT EXTRACTION, BILATERAL    ? ORIF HIP FRACTURE  2006 -2007  ? ? ?Allergies  ?Allergen Reactions  ? Antihistamines, Chlorpheniramine-Type   ? ? ?Outpatient  Encounter Medications as of 03/11/2022  ?Medication Sig  ? acetaminophen (TYLENOL) 325 MG tablet Take 650 mg by mouth in the morning and at bedtime. Scheduled and evry 4 hours as needed  ? Calcium Carb-Cholecalciferol (CALCIUM CARBONATE-VITAMIN D3) 600-400 MG-UNIT TABS Take 1 tablet by mouth in the morning and at bedtime.  ? LORazepam (ATIVAN) 0.5 MG tablet Take 0.5 tablets (0.25 mg total) by mouth 2 (two) times daily as needed for up to 14 days for anxiety.  ? polyethylene glycol powder (GLYCOLAX/MIRALAX) 17 GM/SCOOP powder Take 17 g by mouth every other day.  ? sertraline (ZOLOFT) 25 MG tablet Take 50 mg by mouth daily. 8-11 am  ? Skin Protectants,  Misc. (EUCERIN) cream Apply 1 application. topically daily at 12 noon. 7 am-3 pm for dry skin  ? triamcinolone cream (KENALOG) 0.1 % Apply 1 application. topically 2 (two) times daily for 10 days.  ? [DISCONTINUED] sertraline (ZOLOFT) 50 MG tablet Take 50 mg by mouth daily.  ? ?No facility-administered encounter medications on file as of 03/11/2022.  ? ? ?Review of Systems  ?Constitutional:  Positive for activity change, appetite change and unexpected weight change. Negative for chills, diaphoresis, fatigue and fever.  ?HENT:  Negative for congestion.   ?Respiratory:  Negative for cough, shortness of breath and wheezing.   ?Cardiovascular:  Positive for leg swelling. Negative for chest pain and palpitations.  ?Gastrointestinal:  Negative for abdominal distention, abdominal pain, constipation and diarrhea.  ?Genitourinary:  Negative for difficulty urinating and dysuria.  ?Musculoskeletal:  Positive for back pain and gait problem. Negative for arthralgias, joint swelling and myalgias.  ?Neurological:  Negative for dizziness, tremors, seizures, syncope, facial asymmetry, speech difficulty, weakness (general), light-headedness, numbness and headaches.  ?Psychiatric/Behavioral:  Positive for confusion and dysphoric mood. Negative for agitation, behavioral problems, self-injury, sleep disturbance and suicidal ideas.   ? ?Immunization History  ?Administered Date(s) Administered  ? Fluad Quad(high Dose 65+) 10/01/2019  ? Influenza, High Dose Seasonal PF 10/10/2020  ? Influenza-Unspecified 10/03/2001, 10/07/2010, 10/06/2011, 09/19/2012, 09/28/2013, 10/11/2014, 10/19/2016, 09/30/2021  ? Moderna Covid-19 Vaccine Bivalent Booster 38yr & up 10/20/2021  ? Moderna Sars-Covid-2 Vaccination 01/09/2020, 02/09/2020, 11/01/2020  ? Pneumococcal Conjugate-13 10/06/2020  ? Pneumococcal Polysaccharide-23 01/31/2003, 09/19/2012  ? Tdap 10/08/2021  ? ?Pertinent  Health Maintenance Due  ?Topic Date Due  ? DEXA SCAN  Never done  ? INFLUENZA  VACCINE  Completed  ? ? ?  09/24/2020  ? 10:54 AM 12/31/2020  ? 11:29 AM 10/05/2021  ?  1:02 PM 01/06/2022  ? 11:22 AM 03/01/2022  ?  4:00 PM  ?Fall Risk  ?Falls in the past year? 0 0 0 0 0  ?Was there an injury with Fall? 0 0 0 0 0  ?Fall Risk Category Calculator 0 0 0 0 0  ?Fall Risk Category Low Low Low Low Low  ?Patient Fall Risk Level Low fall risk  Low fall risk Low fall risk Low fall risk  ?Patient at Risk for Falls Due to    No Fall Risks No Fall Risks  ?Fall risk Follow up   Falls evaluation completed Falls evaluation completed Falls evaluation completed  ? ?Functional Status Survey: ?  ? ?Vitals:  ? 03/11/22 1024  ?BP: 135/76  ?Pulse: 92  ?Resp: (!) 24  ?Temp: 97.7 ?F (36.5 ?C)  ?SpO2: 93%  ?Weight: 142 lb 3.2 oz (64.5 kg)  ?Height: 5' 5.8" (1.671 m)  ? ?Body mass index is 23.09 kg/m?.Marland Kitchen?Physical Exam ?Vitals and nursing note reviewed.  ?Constitutional:   ?  General: She is not in acute distress. ?   Appearance: She is not diaphoretic.  ?HENT:  ?   Head: Normocephalic and atraumatic.  ?   Nose: Nose normal.  ?   Mouth/Throat:  ?   Mouth: Mucous membranes are moist.  ?   Pharynx: Oropharynx is clear.  ?Neck:  ?   Vascular: No JVD.  ?Cardiovascular:  ?   Rate and Rhythm: Normal rate and regular rhythm.  ?   Heart sounds: No murmur heard. ?Pulmonary:  ?   Effort: Pulmonary effort is normal. No respiratory distress.  ?   Breath sounds: Rhonchi (Left mid to lower lung) present. No wheezing.  ?Abdominal:  ?   General: Bowel sounds are normal. There is no distension.  ?   Palpations: Abdomen is soft.  ?   Tenderness: There is no abdominal tenderness. There is no right CVA tenderness or left CVA tenderness.  ?Musculoskeletal:     ?   General: Tenderness (T12 L1 area) present. No swelling, deformity or signs of injury.  ?   Comments: BLE edema +1 venous insuff.   ?Skin: ?   General: Skin is warm and dry.  ?Neurological:  ?   General: No focal deficit present.  ?   Mental Status: She is alert. Mental status is at  baseline.  ?   Cranial Nerves: No cranial nerve deficit.  ?Psychiatric:  ?   Comments: Low   ? ? ?Labs reviewed: ?Recent Labs  ?  10/06/21 ?0000  ?NA 139  ?K 4.4  ?CL 106  ?CO2 24*  ?BUN 15  ?CREATININE 0.8  ?CALCIUM

## 2022-03-12 MED ORDER — MORPHINE SULFATE (CONCENTRATE) 20 MG/ML PO SOLN: 5.0000 mg | Freq: Four times a day (QID) | ORAL | 0 refills | Status: AC | PRN

## 2022-03-18 ENCOUNTER — Encounter: Payer: Self-pay | Admitting: Internal Medicine

## 2022-03-20 DEATH — deceased

## 2022-04-19 DEATH — deceased
